# Patient Record
Sex: Female | Born: 2000 | Race: Black or African American | Hispanic: No | Marital: Single | Smoking: Never smoker
Health system: Southern US, Community
[De-identification: ages and names within clinical notes are randomized; demographics above are authoritative.]

## PROBLEM LIST (undated history)

## (undated) ENCOUNTER — Inpatient Hospital Stay (HOSPITAL_COMMUNITY): Payer: Self-pay

## (undated) DIAGNOSIS — Z789 Other specified health status: Secondary | ICD-10-CM

## (undated) HISTORY — DX: Other specified health status: Z78.9

## (undated) HISTORY — PX: NO PAST SURGERIES: SHX2092

---
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
## 2021-01-16 ENCOUNTER — Other Ambulatory Visit: Payer: Self-pay

## 2021-01-16 ENCOUNTER — Encounter (HOSPITAL_COMMUNITY): Payer: Self-pay

## 2021-01-16 ENCOUNTER — Emergency Department (HOSPITAL_COMMUNITY)
Admission: EM | Admit: 2021-01-16 | Discharge: 2021-01-16 | Disposition: A | Discharge status: Home / Self Care | Payer: Medicaid Other | Attending: Emergency Medicine | Admitting: Emergency Medicine

## 2021-01-16 DIAGNOSIS — Z20822 Contact with and (suspected) exposure to covid-19: Secondary | ICD-10-CM | POA: Diagnosis not present

## 2021-01-16 DIAGNOSIS — J029 Acute pharyngitis, unspecified: Secondary | ICD-10-CM | POA: Insufficient documentation

## 2021-01-16 LAB — RESP PANEL BY RT-PCR (FLU A&B, COVID) ARPGX2
Influenza A by PCR: NEGATIVE
Influenza B by PCR: NEGATIVE
SARS Coronavirus 2 by RT PCR: NEGATIVE

## 2021-01-16 LAB — GROUP A STREP BY PCR: Group A Strep by PCR: NOT DETECTED

## 2021-01-16 NOTE — Discharge Instructions (Signed)
COVID 19 and strep test were negative in the department today.  You may treat your symptoms with over-the-counter remedies as discussed.  More information attached to these discharge papers.  Please follow-up with a primary care provider or an urgent care if your symptoms are not improving in the next 5 days.  It was a pleasure to meet you and I hope you feel better.

## 2021-01-16 NOTE — ED Triage Notes (Signed)
Patient complains of sore throat and difficulty swallowing since am. No associated symptoms. Speaking complete sentences and no difficulty swallowing.

## 2021-01-16 NOTE — ED Notes (Signed)
Pt reports waking this am with swollen lymph nodes to right lateral neck. Pt reports hx of intermittent episodes x5 years. Pt also reports difficulty swallowing. Speaking complete sentences

## 2021-01-16 NOTE — ED Provider Notes (Signed)
[facility] EMERGENCY DEPARTMENT Provider Note   CSN: [id number] Arrival date & time: 01/16/21  1133     History No chief complaint on file.   Laelyn Blumenthal is a 20 y.o. female presenting with a complaint of sore throat, voice loss and painful swallowing that began earlier this morning.  Patient also reports that 5 years ago she began to have experience swollen lymph nodes in her neck.  This is an ongoing problem.  Patient denying symptoms such as fever, chills, cough, nasal congestion or NVD.  No chest pain or difficulty breathing.  She has not taken anything for this pain.   History reviewed. No pertinent past medical history.  There are no problems to display for this patient.   History reviewed. No pertinent surgical history.   OB History   No obstetric history on file.     Family History  Problem Relation Age of Onset   Diabetes Mother     Social History   Tobacco Use   Smoking status: Never   Smokeless tobacco: Never  Substance Use Topics   Alcohol use: No   Drug use: No    Home Medications Prior to Admission medications   Not on File    Allergies    Patient has no known allergies.  Review of Systems   Review of Systems  Constitutional: Negative.   HENT:  Positive for sore throat and trouble swallowing. Negative for congestion.   Respiratory:  Negative for cough and shortness of breath.   Cardiovascular:  Negative for chest pain and palpitations.  Gastrointestinal:  Negative for constipation, diarrhea, nausea and vomiting.  Neurological:  Negative for dizziness and headaches.  All other systems reviewed and are negative.  Physical Exam Updated Vital Signs BP 124/85   Pulse 98   Temp 98.7 F (37.1 C) (Oral)   Resp 18   SpO2 100%   Physical Exam Vitals and nursing note reviewed.  Constitutional:      Appearance: Normal appearance.  HENT:     Head: Normocephalic and atraumatic.     Right Ear: External ear normal.     Left  Ear: External ear normal.     Mouth/Throat:     Mouth: Mucous membranes are moist.     Pharynx: Oropharynx is clear.  Eyes:     General: No scleral icterus.    Conjunctiva/sclera: Conjunctivae normal.     Pupils: Pupils are equal, round, and reactive to light.  Neck:     Comments: Some tenderness along SCM of right neck.  Also with mildly enlarged cervical lymph nodes bilaterally.  No other lymphadenopathy.  Lymph nodes round and mobile. Cardiovascular:     Rate and Rhythm: Normal rate and regular rhythm.  Pulmonary:     Effort: Pulmonary effort is normal. No respiratory distress.  Musculoskeletal:     Cervical back: Normal range of motion. Tenderness present.  Lymphadenopathy:     Cervical: Cervical adenopathy present.  Skin:    Findings: No rash.  Neurological:     Mental Status: She is alert.  Psychiatric:        Mood and Affect: Mood normal.    ED Results / Procedures / Treatments   Labs (all labs ordered are listed, but only abnormal results are displayed) Labs Reviewed - No data to display  EKG None  Radiology No results found.  Procedures Procedures   Medications Ordered in ED Medications - No data to display  ED Course  I have reviewed  the triage vital signs and the nursing notes.  Pertinent labs & imaging results that were available during my care of the patient were reviewed by me and considered in my medical decision making (see chart for details).   MDM Rules/Calculators/A&P Konstance Happel is a 20 y.o. female presenting with a complaint of sore throat, voice loss and painful swallowing that began earlier this morning.  Patient also reports that 5 years ago she began to have experience swollen lymph nodes in her neck.  This is an ongoing problem.  Patient denying symptoms such as fever, chills, cough, nasal congestion or NVD.  No chest pain or difficulty breathing.  She has not taken anything for this pain.  Patient's throat was clear without signs of PTA or  other obstructing process. No enlarged tonsils and uvula midline. No exudate. Patient had cervical lymphadenopathy however I reviewed her charts and this appears to be chronic. She has been treated for this outpatient and there is no concern for emergent condition. Reports the lymph nodes to be slightly more tender and inflamed today. We discussed the function of lymph nodes and the potential for inflammation when we are fighting infection.  Patient afebrile today.  COVID and strep test were negative in the department.  We discussed that because the symptoms began this morning it may be too soon for positive COVID test.  Patient continued without shortness of breath, chest pain or difficulty swallowing throughout her evaluation today.  Drinking fluids at this time.  I suspect her symptoms to be due to a very early viral process.  We discussed over-the-counter management and I also attached this to her discharge papers.  Return precautions such as shortness of breath, chest pain, inability to tolerate fluids or food or overall worsening of her condition were discussed.  These were also attached to her discharge papers.  Patient agreeable to discharge at this time.  Final Clinical Impression(s) / ED Diagnoses Final diagnoses:  Pharyngitis, unspecified etiology    Rx / DC Orders Results and diagnoses were explained to the patient. Return precautions discussed in full. Patient had no additional questions and expressed complete understanding.     Saddie Benders, PA-C 01/16/21 1542    Franne Forts, DO 01/17/21 1127

## 2021-07-28 ENCOUNTER — Encounter (HOSPITAL_COMMUNITY): Payer: Self-pay

## 2021-07-28 ENCOUNTER — Ambulatory Visit (HOSPITAL_COMMUNITY)
Admission: EM | Admit: 2021-07-28 | Discharge: 2021-07-28 | Disposition: A | Discharge status: Home / Self Care | Payer: Medicaid Other | Attending: Nurse Practitioner | Admitting: Nurse Practitioner

## 2021-07-28 ENCOUNTER — Ambulatory Visit (INDEPENDENT_AMBULATORY_CARE_PROVIDER_SITE_OTHER): Payer: Medicaid Other

## 2021-07-28 DIAGNOSIS — M79644 Pain in right finger(s): Secondary | ICD-10-CM | POA: Diagnosis not present

## 2021-07-28 DIAGNOSIS — L03011 Cellulitis of right finger: Secondary | ICD-10-CM | POA: Diagnosis not present

## 2021-07-28 MED ORDER — KETOROLAC TROMETHAMINE 30 MG/ML IJ SOLN
30.0000 mg | Freq: Once | INTRAMUSCULAR | Status: AC
  Administered 2021-07-28: 30 mg via INTRAMUSCULAR

## 2021-07-28 MED ORDER — KETOROLAC TROMETHAMINE 60 MG/2ML IM SOLN
INTRAMUSCULAR | Status: AC
  Filled 2021-07-28: qty 2

## 2021-07-28 MED ORDER — DOXYCYCLINE HYCLATE 100 MG PO CAPS: 100.0000 mg | ORAL_CAPSULE | Freq: Two times a day (BID) | ORAL | 0 refills | Status: AC

## 2021-07-28 NOTE — Discharge Instructions (Signed)
-   We gave you a shot of Toradol 30 mg IM in the office today ?- Please start antibiotics for the infection in your nail bed ?- You can also use warm soaks to help draw the infection out  ?- Alternate Tylenol and ibuprofen for pain ?- If the swelling worsens, please come back to see [place] ?

## 2021-07-28 NOTE — ED Triage Notes (Signed)
Pt presents with c/o pain and swelling to the R middle finger.  ?

## 2021-07-28 NOTE — ED Provider Notes (Signed)
?[facility] ? ? ? ?CSN: [id number] ?Arrival date & time: 07/28/21  1155 ? ? ?  ? ?History   ?Chief Complaint ?Chief Complaint  ?Patient presents with  ? swollen finger  ? ? ?HPI ?Selena Dawson is a 21 y.o. female.  ? ?Patient reports pain to the right middle finger that started at 2 am today.  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  She denies fevers, nausea/vomiting, and numbness or tinging in her fingers.  She reports the pain in her right middle finger is severe.  She has not taken anything for the pain. ? ?LMP 07/26/2021. ? ? ?History reviewed. No pertinent past medical history. ? ?There are no problems to display for this patient. ? ? ?History reviewed. No pertinent surgical history. ? ?OB History   ?No obstetric history on file. ?  ? ? ? ?Home Medications   ? ?Prior to Admission medications   ?Medication Sig Start Date End Date Taking? Authorizing Provider  ?doxycycline (VIBRAMYCIN) 100 MG capsule Take 1 capsule (100 mg total) by mouth 2 (two) times daily for 7 days. 07/28/21 08/04/21 Yes Valentino Nose, NP  ? ? ?Family History ?Family History  ?Problem Relation Age of Onset  ? Diabetes Mother   ? ? ?Social History ?Social History  ? ?Tobacco Use  ? Smoking status: Never  ? Smokeless tobacco: Never  ?Substance Use Topics  ? Alcohol use: No  ? Drug use: No  ? ? ? ?Allergies   ?Patient has no known allergies. ? ? ?Review of Systems ?Review of Systems ?Per HPI ? ?Physical Exam ?Triage Vital Signs ?ED Triage Vitals [07/28/21 1305]  ?Enc Vitals Group  ?   BP 133/89  ?   Pulse Rate 67  ?   Resp 19  ?   Temp   ?   Temp src   ?   SpO2 96 %  ?   Weight   ?   Height   ?   Head Circumference   ?   Peak Flow   ?   Pain Score 10  ?   Pain Loc   ?   Pain Edu?   ?   Excl. in GC?   ? ?No data found. ? ?Updated Vital Signs ?BP 133/89 (BP Location: Right Arm)   Pulse 67   Resp 19   LMP 07/26/2021 (Exact Date)   SpO2 96%  ? ?Visual Acuity ?Right  Eye Distance:   ?Left Eye Distance:   ?Bilateral Distance:   ? ?Right Eye Near:   ?Left Eye Near:    ?Bilateral Near:    ? ?Physical Exam ?Vitals reviewed.  ?Constitutional:   ?   General: She is not in acute distress. ?   Appearance: Normal appearance. She is not toxic-appearing.  ?HENT:  ?   Head: Normocephalic and atraumatic.  ?Pulmonary:  ?   Effort: Pulmonary effort is normal. No respiratory distress.  ?Musculoskeletal:  ?   Right hand: Swelling present. No tenderness or bony tenderness. Decreased range of motion. Normal strength. Normal sensation. Normal capillary refill. Normal pulse.  ?   Left hand: Normal. No tenderness. Normal strength. Normal sensation. Normal capillary refill. Normal pulse.  ?   Comments: Right third finger with firm paronychia to nail bed  ?Skin: ?   General: Skin is warm and dry.  ?   Coloration: Skin is not jaundiced or pale.  ?  Findings: Erythema present.  ?Neurological:  ?   Mental Status: She is alert and oriented to person, place, and time.  ?   Motor: No weakness.  ?   Gait: Gait normal.  ?Psychiatric:     ?   Mood and Affect: Mood normal.     ?   Behavior: Behavior normal.     ?   Thought Content: Thought content normal.     ?   Judgment: Judgment normal.  ? ? ? ?UC Treatments / Results  ?Labs ?(all labs ordered are listed, but only abnormal results are displayed) ?Labs Reviewed - No data to display ? ?EKG ? ? ?Radiology ?DG Finger Middle Right ? ?Result Date: 07/28/2021 ?CLINICAL DATA:  Pain and swelling EXAM: RIGHT MIDDLE FINGER 3 views COMPARISON:  None. FINDINGS: There is no evidence of fracture or dislocation. There is no evidence of arthropathy or other focal bone abnormality. Soft tissues are unremarkable. IMPRESSION: No radiographic abnormality is seen in the right middle finger. Electronically Signed   By: Ernie Avena M.D.   On: 07/28/2021 14:01   ? ?Procedures ?Procedures (including critical care time) ? ?Medications Ordered in UC ?Medications  ?ketorolac  (TORADOL) 30 MG/ML injection 30 mg (30 mg Intramuscular Given 07/28/21 1408)  ? ? ?Initial Impression / Assessment and Plan / UC Course  ?I have reviewed the triage vital signs and the nursing notes. ? ?Pertinent labs & imaging results that were available during my care of the patient were reviewed by me and considered in my medical decision making (see chart for details). ? ?  ?Finger x-ray today does not show any acute bony abnormality.  Suspect paronychia of right third digit.  Will not drain as it is firm.  Instead, will start on doxycycline 100 mg twice daily for 7 days.  Encouraged warm soaks of the right third digit to help with drainage.  Can use Tylenol and ibuprofen alternating for pain. ?Final Clinical Impressions(s) / UC Diagnoses  ? ?Final diagnoses:  ?Paronychia of finger of right hand  ? ? ? ?Discharge Instructions   ? ?  ?- We gave you a shot of Toradol 30 mg IM in the office today ?- Please start antibiotics for the infection in your nail bed ?- You can also use warm soaks to help draw the infection out  ?- Alternate Tylenol and ibuprofen for pain ?- If the swelling worsens, please come back to see [place] ? ? ? ? ?ED Prescriptions   ? ? Medication Sig Dispense Auth. Provider  ? doxycycline (VIBRAMYCIN) 100 MG capsule Take 1 capsule (100 mg total) by mouth 2 (two) times daily for 7 days. 14 capsule Valentino Nose, NP  ? ?  ? ?PDMP not reviewed this encounter. ?  ?Valentino Nose, NP ?07/28/21 1442 ? ?

## 2021-08-16 IMAGING — DX DG CHEST 2V
2 series · 2 of 2 positions shown · non-contrast
Comparison: Chest x-ray 06/29/2015.

CLINICAL DATA: 19-year-old female with history of cervical
lymphadenopathy.

EXAM:
CHEST - 2 VIEW

[chest pa]
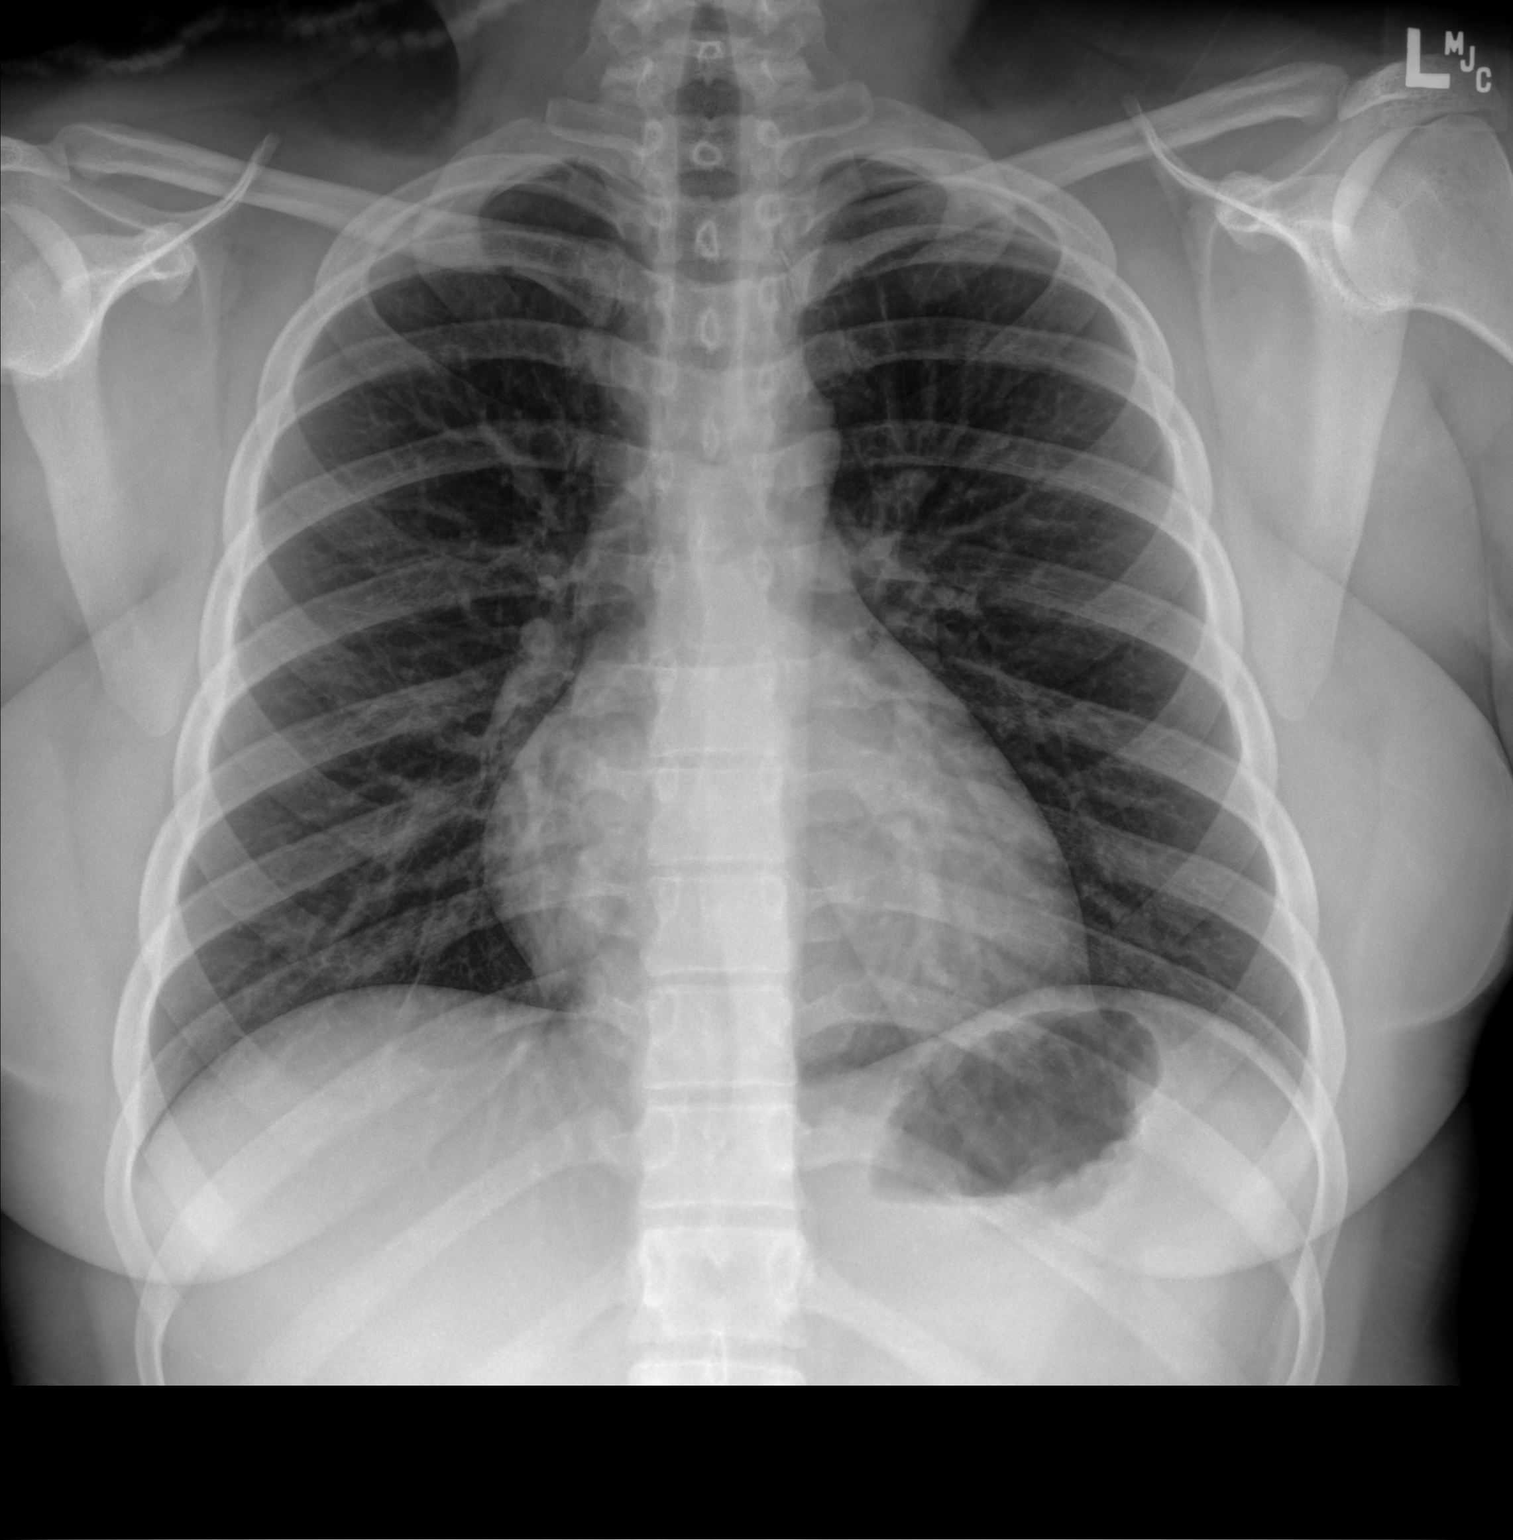

[chest lat]
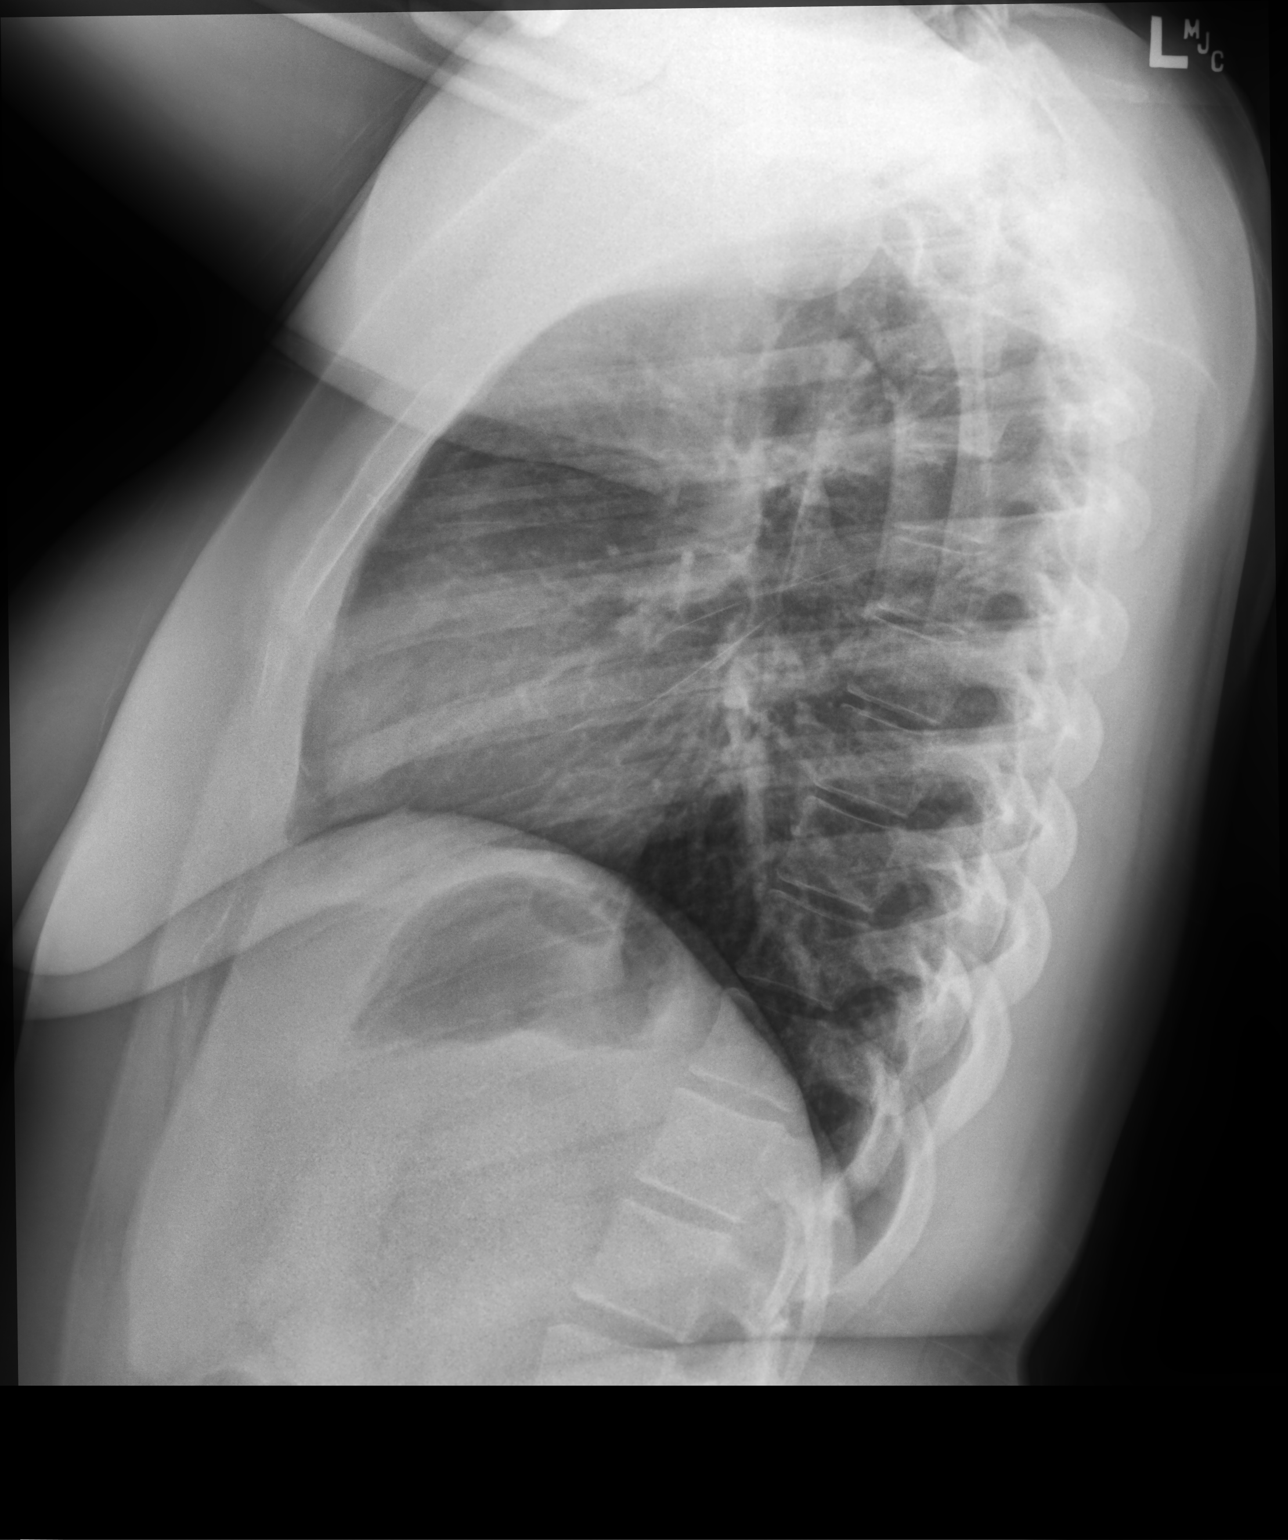

[2 of 2 positions shown; findings below may reference images not displayed]

FINDINGS: Lung volumes are normal. No consolidative airspace disease. No
pleural effusions. No pneumothorax. No pulmonary nodule or mass
noted. Pulmonary vasculature and the cardiomediastinal silhouette
are within normal limits.
IMPRESSION: No radiographic evidence of acute cardiopulmonary disease.

## 2021-08-18 IMAGING — US US SOFT TISSUE HEAD/NECK
1 series · 14 of 21 positions shown · non-contrast
Comparison: None.

CLINICAL DATA: 19-year-old female with a history lymphadenopathy

EXAM:
ULTRASOUND OF HEAD/NECK SOFT TISSUES
TECHNIQUE: Ultrasound examination of the head and neck soft tissues was
performed in the area of clinical concern.

[Series 1: us soft tissue head/neck · 0.07mm/px · 14 of 21 slices shown]
[im 1/21]
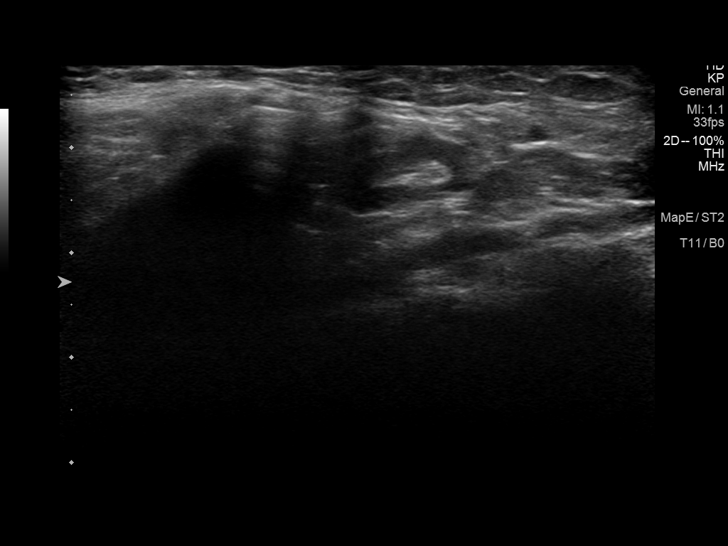
[im 3/21]
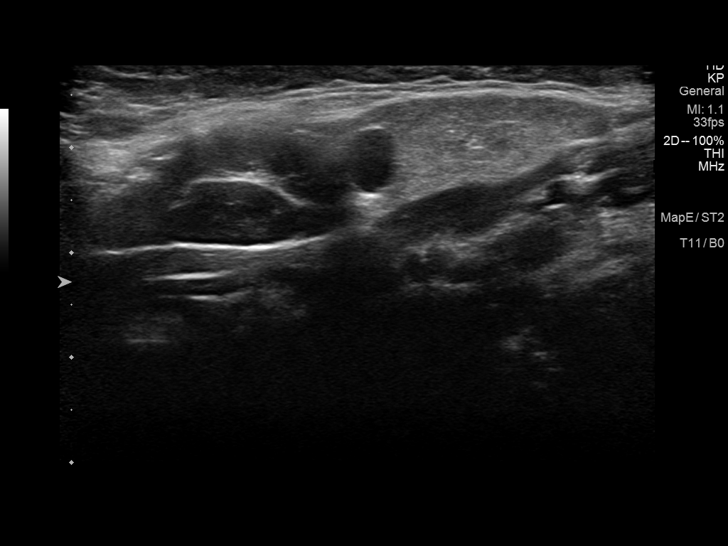
[im 4/21]
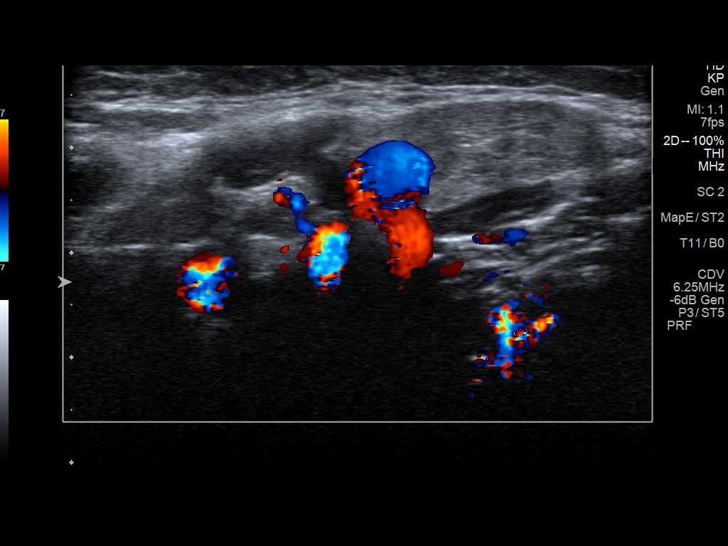
[im 6/21]
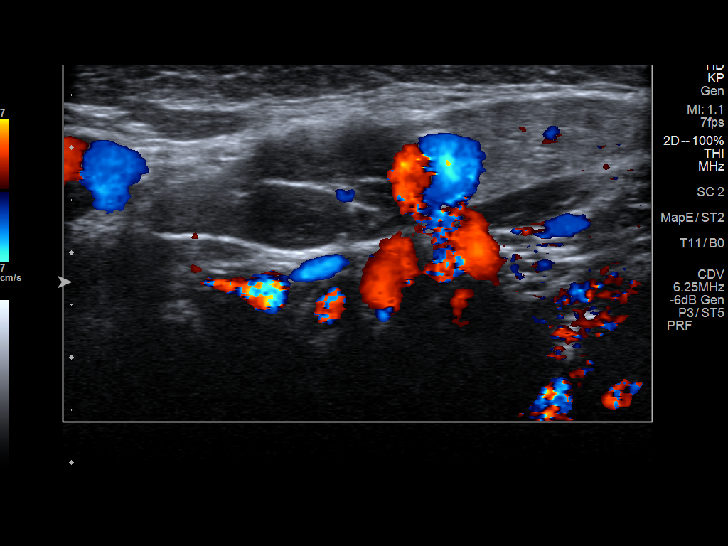
[im 7/21]
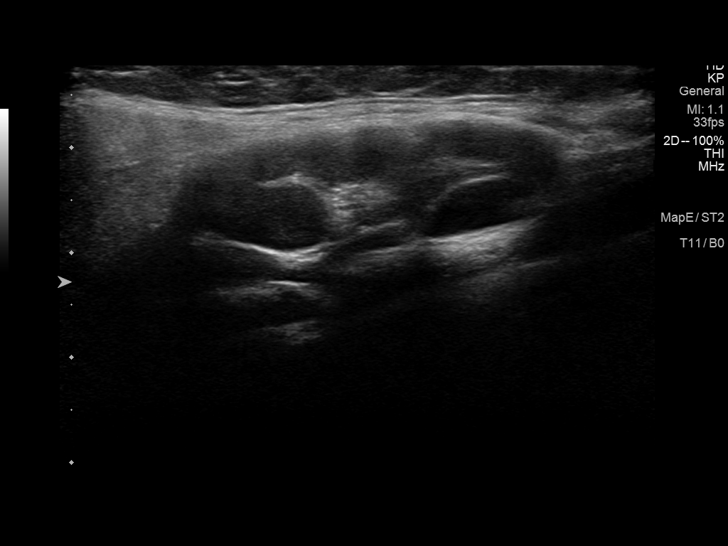
[im 9/21]
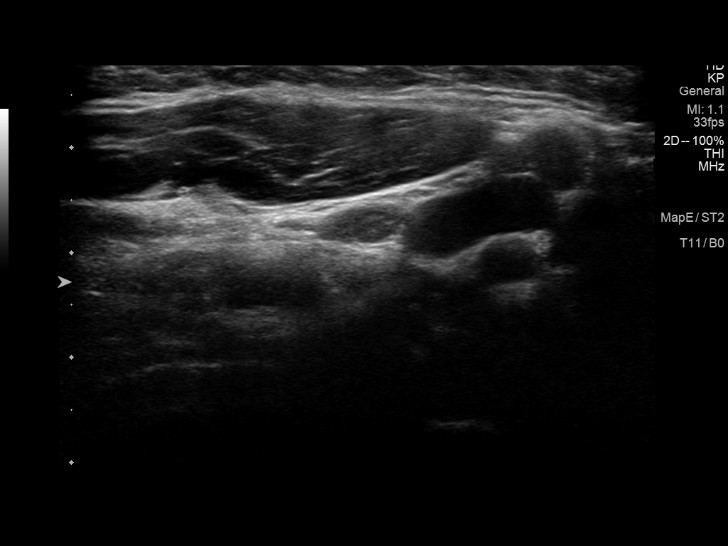
[im 10/21]
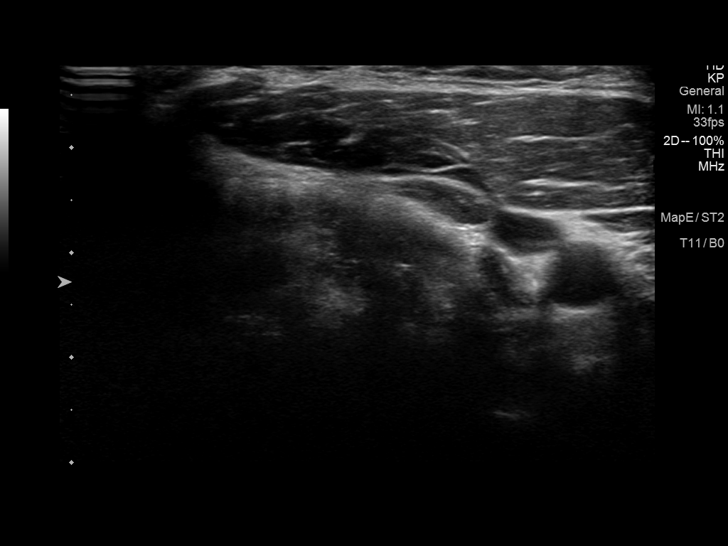
[im 12/21]
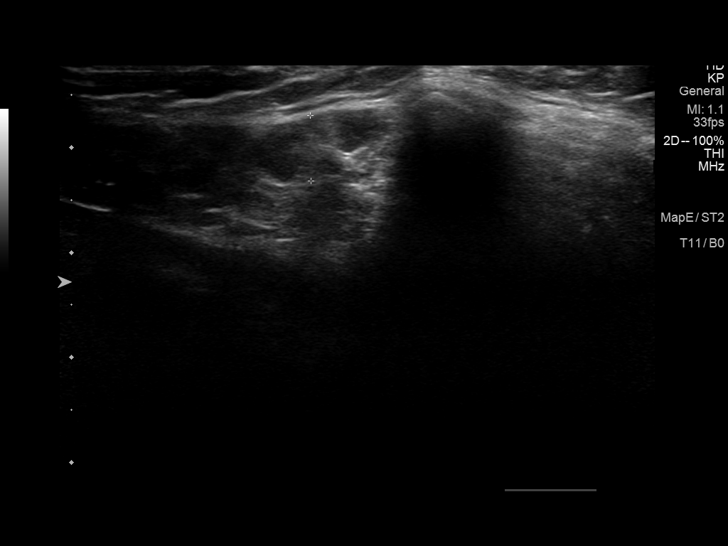
[im 13/21]
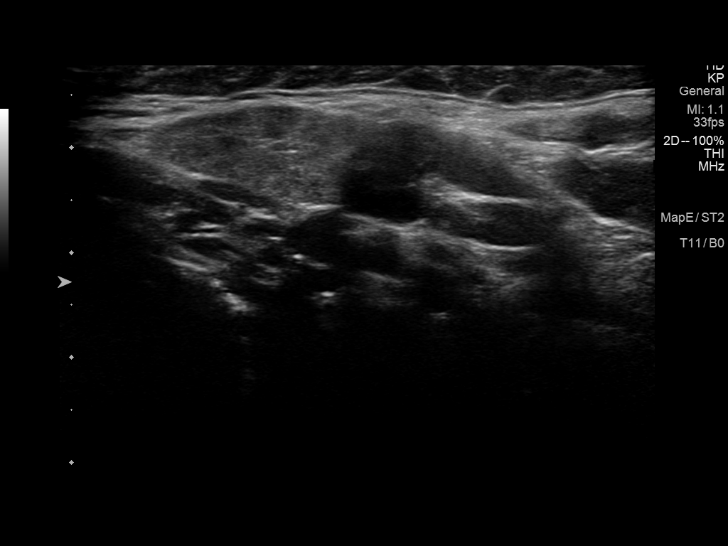
[im 15/21]
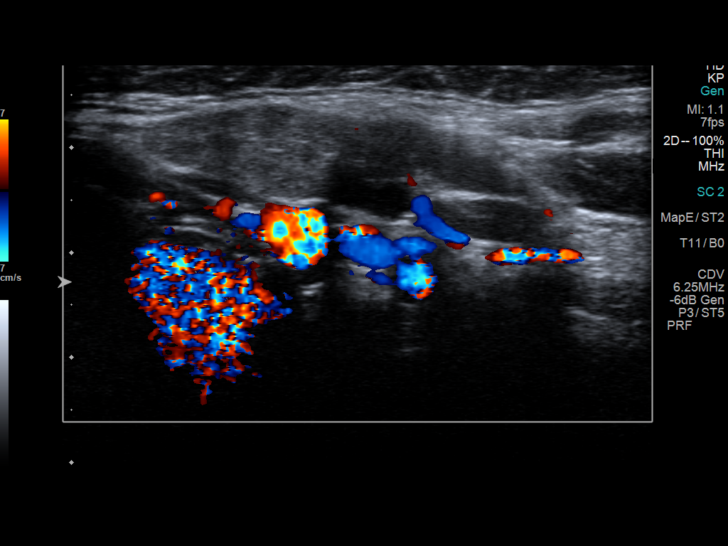
[im 16/21]
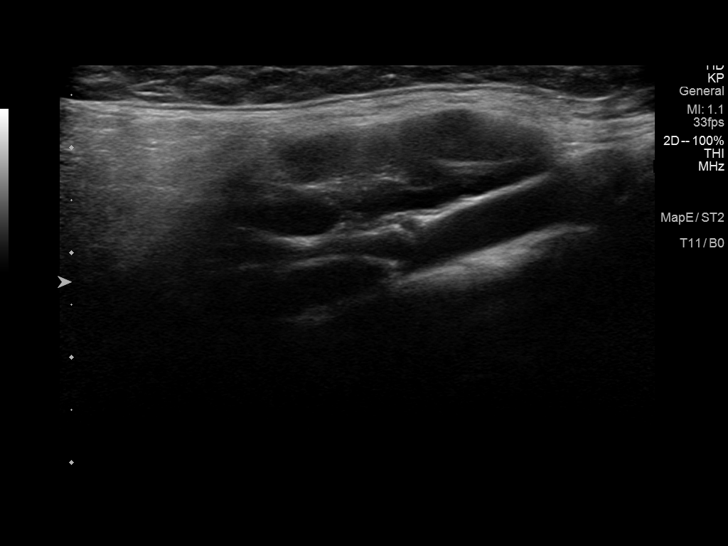
[im 18/21]
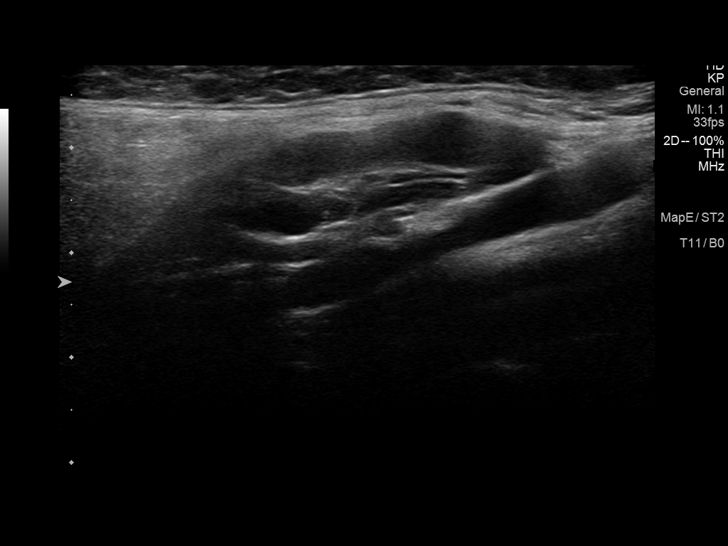
[im 19/21]
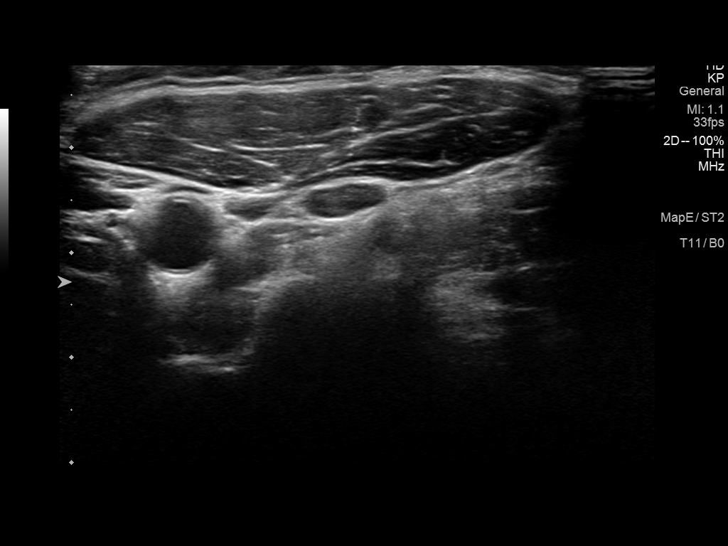
[im 21/21]
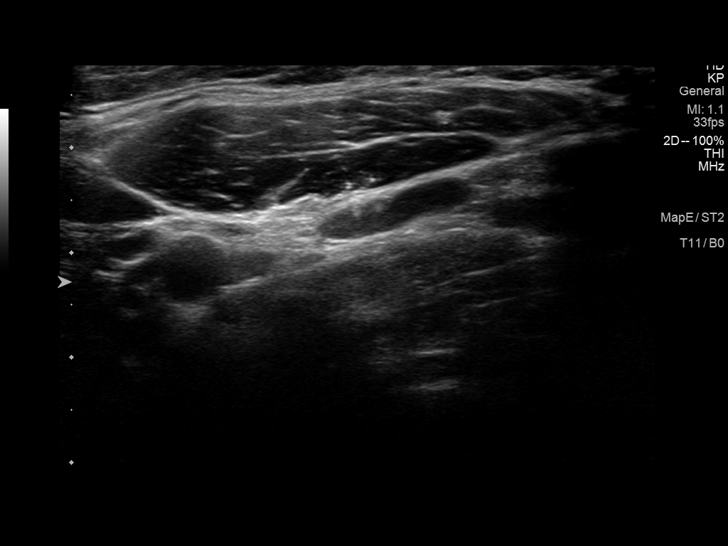

[14 of 21 positions shown; findings below may reference images not displayed]

FINDINGS: Sonographic survey with grayscale and color duplex performed in the
region clinical concern.

Typical appearing lymph nodes are present, not enlarged. No focal
fluid or soft tissue lesions.
IMPRESSION: Sonographic survey demonstrates typical appearing lymph nodes,
nonspecific though potentially reactive.

## 2021-09-08 ENCOUNTER — Telehealth: Payer: Medicaid Other | Admitting: Physician Assistant

## 2021-09-08 DIAGNOSIS — L700 Acne vulgaris: Secondary | ICD-10-CM | POA: Diagnosis not present

## 2021-09-08 MED ORDER — CLINDAMYCIN PHOS-BENZOYL PEROX 1-5 % EX GEL: Freq: Two times a day (BID) | CUTANEOUS | 0 refills | Status: DC

## 2021-09-08 NOTE — Progress Notes (Signed)
We are sorry that you are experiencing this issue.  Here is how we plan to help!  Based on what you shared with me it looks like you have uncomplicated acne.  Acne is a disorder of the hair follicles and oil glands (sebaceous glands). The sebaceous glands secrete oils to keep the skin moist.  When the glands get clogged, it can lead to pimples or cysts.  These cysts may become infected and leave scars. Acne is very common and normally occurs at puberty.  Acne is also inherited.  Your personal care plan consists of the following recommendations:  I recommend that you use a daily cleanser  You may try a topical exfoliator and salicylic acid scrub.  These scrubs have coarse particles that clear your pores but may also irritate your skin.  I have prescribed a topical gel with an antibiotic:  Clindamycin-benzoyl peroxide gel.  This gel should be applied to the affected areas twice a day.  Be sure to read the package insert to understand potential side effects.  If excessive dryness or peeling occurs, reduce dose frequency or concentration of the topical scrubs.  If excessive stinging or burning occurs, remove the topical gel with mild soap and water and resume at a lower dose the next day.  Remember oral antibiotics and topical acne treatments may increase your sensitivity to the sun!  HOME CARE: Do not squeeze pimples because that can often lead to infections, worse acne, and scars. Use a moisturizer that contains retinoid or fruit acids that may inhibit the development of new acne lesions. Although there is not a clear link that foods can cause acne, doctors do believe that too many sweets predispose you to skin problems.  GET HELP RIGHT AWAY IF: If your acne gets worse or is not better within 10 days. If you become depressed. If you become pregnant, discontinue medications and call your OB/GYN.  MAKE SURE YOU: Understand these instructions. Will watch your condition. Will get help right  away if you are not doing well or get worse.  Thank you for choosing an e-visit.  Your e-visit answers were reviewed by a board certified advanced clinical practitioner to complete your personal care plan. Depending upon the condition, your plan could have included both over the counter or prescription medications.  Please review your pharmacy choice. Make sure the pharmacy is open so you can pick up prescription now. If there is a problem, you may contact your provider through MyChart messaging and have the prescription routed to another pharmacy.  Your safety is important to us. If you have drug allergies check your prescription carefully.   For the next 24 hours you can use MyChart to ask questions about today's visit, request a non-urgent call back, or ask for a work or school excuse. You will get an email in the next two days asking about your experience. I hope that your e-visit has been valuable and will speed your recovery.  I provided 5 minutes of non face-to-face time during this encounter for chart review and documentation.   

## 2021-11-23 ENCOUNTER — Encounter (HOSPITAL_COMMUNITY): Payer: Self-pay | Admitting: *Deleted

## 2021-11-23 ENCOUNTER — Ambulatory Visit (HOSPITAL_COMMUNITY)
Admission: EM | Admit: 2021-11-23 | Discharge: 2021-11-23 | Disposition: A | Discharge status: Home / Self Care | Payer: Medicaid Other | Attending: Physician Assistant | Admitting: Physician Assistant

## 2021-11-23 DIAGNOSIS — J0140 Acute pansinusitis, unspecified: Secondary | ICD-10-CM | POA: Diagnosis not present

## 2021-11-23 MED ORDER — AMOXICILLIN-POT CLAVULANATE 875-125 MG PO TABS: 1.0000 | ORAL_TABLET | Freq: Two times a day (BID) | ORAL | 0 refills | Status: DC

## 2021-11-23 MED ORDER — PREDNISONE 20 MG PO TABS: 40.0000 mg | ORAL_TABLET | Freq: Every day | ORAL | 0 refills | Status: DC

## 2021-11-23 MED ORDER — PREDNISONE 20 MG PO TABS: 40.0000 mg | ORAL_TABLET | Freq: Every day | ORAL | 0 refills | Status: AC

## 2021-11-23 NOTE — ED Triage Notes (Addendum)
C/O cough x 2 wks; states started with sore throat and feels she has swollen lymph nodes onset 3 days ago. Denies fevers. C/O runny nose. Pt frequently coughing and sniffling throughout triage.

## 2021-11-23 NOTE — ED Provider Notes (Signed)
[facility]    CSN: [id number] Arrival date & time: 11/23/21  1527      History   Chief Complaint Chief Complaint  Patient presents with   Sore Throat   Cough    HPI Selena Dawson is a 21 y.o. female.   Patient presents today with a 2-week history of URI symptoms.  Reports nasal congestion, cough, ear fullness, headache.  Denies any chest pain, shortness of breath, nausea, vomiting.  She does believe that she has allergies but has never been officially diagnosed with this and does not take any over-the-counter medications on a regular basis.  Denies history of asthma, COPD, smoking.  She has had COVID in the past.  Last episode was approximately 1 year ago.  She has not had a COVID-vaccine.  She is confident that she is not pregnant.  Denies any recent antibiotic or steroid use.  She has been having difficulty with daily duties as a result of symptoms; has trouble sleeping due to severity of cough.    History reviewed. No pertinent past medical history.  There are no problems to display for this patient.   History reviewed. No pertinent surgical history.  OB History   No obstetric history on file.      Home Medications    Prior to Admission medications   Medication Sig Start Date End Date Taking? Authorizing Provider  amoxicillin-clavulanate (AUGMENTIN) 875-125 MG tablet Take 1 tablet by mouth every 12 (twelve) hours. 11/23/21  Yes Nilam Quakenbush K, PA-C  predniSONE (DELTASONE) 20 MG tablet Take 2 tablets (40 mg total) by mouth daily for 3 days. 11/23/21 11/26/21 Yes Briya Lookabaugh, Noberto Retort, PA-C    Family History Family History  Problem Relation Age of Onset   Diabetes Mother     Social History Social History   Tobacco Use   Smoking status: Never   Smokeless tobacco: Never  Vaping Use   Vaping Use: Never used  Substance Use Topics   Alcohol use: Yes    Comment: occasionally   Drug use: Yes    Types: Marijuana     Allergies   Patient has no known  allergies.   Review of Systems Review of Systems  Constitutional:  Positive for activity change. Negative for appetite change, fatigue and fever.  HENT:  Positive for congestion, sinus pressure and sore throat. Negative for sneezing.   Respiratory:  Positive for cough. Negative for shortness of breath.   Cardiovascular:  Negative for chest pain.  Gastrointestinal:  Negative for abdominal pain, diarrhea, nausea and vomiting.  Neurological:  Positive for headaches. Negative for dizziness and light-headedness.     Physical Exam Triage Vital Signs ED Triage Vitals  Enc Vitals Group     BP 11/23/21 1618 118/68     Pulse Rate 11/23/21 1618 78     Resp 11/23/21 1618 16     Temp 11/23/21 1618 98.3 F (36.8 C)     Temp Source 11/23/21 1618 Oral     SpO2 11/23/21 1618 98 %     Weight --      Height --      Head Circumference --      Peak Flow --      Pain Score 11/23/21 1619 8     Pain Loc --      Pain Edu? --      Excl. in GC? --    No data found.  Updated Vital Signs BP 118/68   Pulse 78   Temp  98.3 F (36.8 C) (Oral)   Resp 16   LMP 10/23/2021 (Exact Date)   SpO2 98%   Visual Acuity Right Eye Distance:   Left Eye Distance:   Bilateral Distance:    Right Eye Near:   Left Eye Near:    Bilateral Near:     Physical Exam Vitals reviewed.  Constitutional:      General: She is awake. She is not in acute distress.    Appearance: Normal appearance. She is well-developed. She is not ill-appearing.     Comments: Very pleasant female presented age in no acute distress sitting comfortably in exam room  HENT:     Head: Normocephalic and atraumatic.     Right Ear: Tympanic membrane, ear canal and external ear normal. Tympanic membrane is not erythematous or bulging.     Left Ear: Tympanic membrane, ear canal and external ear normal. Tympanic membrane is not erythematous or bulging.     Nose:     Right Sinus: Maxillary sinus tenderness present. No frontal sinus tenderness.      Left Sinus: Maxillary sinus tenderness present. No frontal sinus tenderness.     Mouth/Throat:     Pharynx: Uvula midline. Posterior oropharyngeal erythema present. No oropharyngeal exudate.     Comments: Drainage present posterior oropharynx Cardiovascular:     Rate and Rhythm: Normal rate and regular rhythm.     Heart sounds: Normal heart sounds, S1 normal and S2 normal. No murmur heard. Pulmonary:     Effort: Pulmonary effort is normal.     Breath sounds: Normal breath sounds. No wheezing, rhonchi or rales.     Comments: Clear to auscultation bilaterally Psychiatric:        Behavior: Behavior is cooperative.      UC Treatments / Results  Labs (all labs ordered are listed, but only abnormal results are displayed) Labs Reviewed - No data to display  EKG   Radiology No results found.  Procedures Procedures (including critical care time)  Medications Ordered in UC Medications - No data to display  Initial Impression / Assessment and Plan / UC Course  I have reviewed the triage vital signs and the nursing notes.  Pertinent labs & imaging results that were available during my care of the patient were reviewed by me and considered in my medical decision making (see chart for details).     No indication for viral testing as patient has been symptomatic for several weeks and this would not change management.  Given prolonged and worsening symptoms will cover for secondary infection with antibiotics.  Patient was started on Augmentin twice daily for 7 days with instruction to take this with food to prevent GI side effects.  We will also start prednisone burst of 40 mg for 3 days.  Discussed that she should not take NSAIDs with this medication due to risk of GI bleeding.  Can take Mucinex, Flonase, Tylenol for additional symptom relief.  She is to rest and drink plenty of fluid.  Discussed that her symptoms are not improving within a week she needs to return for reevaluation.   If she has any worsening symptoms including chest pain, shortness of breath, headache, dizziness, fever, nausea, vomiting she is to be seen immediately.  Strict return precautions given.  Final Clinical Impressions(s) / UC Diagnoses   Final diagnoses:  Acute non-recurrent pansinusitis     Discharge Instructions      Start Augmentin twice daily for 7 days.  Take this with food as it  will upset your stomach.  Take prednisone 40 mg for 3 days.  Do not take NSAIDs with this medication including aspirin, ibuprofen/Advil, naproxen/Aleve as it will cause stomach bleeding.  You can use Tylenol, Mucinex, Flonase for symptom relief.  Make sure you rest and drink plenty fluid.  If your symptoms not proving by next week return for reevaluation.  If anything worsens you need to be seen immediately.     ED Prescriptions     Medication Sig Dispense Auth. Provider   amoxicillin-clavulanate (AUGMENTIN) 875-125 MG tablet Take 1 tablet by mouth every 12 (twelve) hours. 14 tablet Anhad Sheeley K, PA-C   predniSONE (DELTASONE) 20 MG tablet Take 2 tablets (40 mg total) by mouth daily for 3 days. 6 tablet Robbin Escher, Noberto Retort, PA-C      PDMP not reviewed this encounter.   Jeani Hawking, PA-C 11/23/21 1648

## 2021-11-23 NOTE — Discharge Instructions (Signed)
Start Augmentin twice daily for 7 days.  Take this with food as it will upset your stomach.  Take prednisone 40 mg for 3 days.  Do not take NSAIDs with this medication including aspirin, ibuprofen/Advil, naproxen/Aleve as it will cause stomach bleeding.  You can use Tylenol, Mucinex, Flonase for symptom relief.  Make sure you rest and drink plenty fluid.  If your symptoms not proving by next week return for reevaluation.  If anything worsens you need to be seen immediately.

## 2022-08-19 ENCOUNTER — Encounter (HOSPITAL_COMMUNITY): Payer: Self-pay

## 2022-08-19 ENCOUNTER — Ambulatory Visit (HOSPITAL_COMMUNITY)
Admission: EM | Admit: 2022-08-19 | Discharge: 2022-08-19 | Disposition: A | Discharge status: Home / Self Care | Payer: Medicaid Other | Attending: Emergency Medicine | Admitting: Emergency Medicine

## 2022-08-19 DIAGNOSIS — J069 Acute upper respiratory infection, unspecified: Secondary | ICD-10-CM | POA: Diagnosis present

## 2022-08-19 LAB — POCT RAPID STREP A (OFFICE): Rapid Strep A Screen: NEGATIVE

## 2022-08-19 MED ORDER — PROMETHAZINE-DM 6.25-15 MG/5ML PO SYRP: 5.0000 mL | ORAL_SOLUTION | Freq: Four times a day (QID) | ORAL | 0 refills | Status: DC | PRN

## 2022-08-19 NOTE — ED Provider Notes (Signed)
[facility]    CSN: [id number] Arrival date & time: 08/19/22  8469      History   Chief Complaint Chief Complaint  Patient presents with   Sore Throat   Dizziness    HPI Selena Dawson is a 22 y.o. female.   Patient presents to clinic for sore throat, swollen lymph nodes, dry cough and nausea for the past 3 days. Reports subjective fever with night sweats, hot and cold chills.  She has been coughing up phlegm and this makes her nauseous.  Denies abdominal pain, emesis or diarrhea.  Denies wheezing, shortness of breath or chest pain.  She has taken ibuprofen for her pain 2 days ago.  Tearful in clinic.  The history is provided by the patient and medical records.  Sore Throat Pertinent negatives include no chest pain, no abdominal pain and no shortness of breath.  Dizziness Associated symptoms: nausea   Associated symptoms: no chest pain, no diarrhea, no shortness of breath and no vomiting     History reviewed. No pertinent past medical history.  There are no problems to display for this patient.   History reviewed. No pertinent surgical history.  OB History   No obstetric history on file.      Home Medications    Prior to Admission medications   Medication Sig Start Date End Date Taking? Authorizing Provider  promethazine-dextromethorphan (PROMETHAZINE-DM) 6.25-15 MG/5ML syrup Take 5 mLs by mouth 4 (four) times daily as needed for cough. 08/19/22  Yes Vendetta Pittinger, [place], FNP    Family History Family History  Problem Relation Age of Onset   Diabetes Mother     Social History Social History   Tobacco Use   Smoking status: Never   Smokeless tobacco: Never  Vaping Use   Vaping Use: Every day   Substances: Nicotine, Flavoring  Substance Use Topics   Alcohol use: Yes    Comment: occasionally   Drug use: Yes    Types: Marijuana     Allergies   Patient has no known allergies.   Review of Systems Review of Systems  Constitutional:   Positive for chills and fever.  HENT:  Positive for congestion and sore throat.   Respiratory:  Positive for cough. Negative for shortness of breath and wheezing.   Cardiovascular:  Negative for chest pain.  Gastrointestinal:  Positive for nausea. Negative for abdominal pain, diarrhea and vomiting.  Genitourinary:  Negative for dysuria.  Neurological:  Positive for dizziness.     Physical Exam Triage Vital Signs ED Triage Vitals [08/19/22 0954]  Enc Vitals Group     BP      Pulse      Resp      Temp      Temp src      SpO2      Weight      Height      Head Circumference      Peak Flow      Pain Score 8     Pain Loc      Pain Edu?      Excl. in GC?    No data found.  Updated Vital Signs BP 107/72 (BP Location: Left Arm)   Pulse 63   Temp 98.5 F (36.9 C) (Oral)   Resp 14   LMP 07/25/2022 (Approximate)   SpO2 96%   Visual Acuity Right Eye Distance:   Left Eye Distance:   Bilateral Distance:    Right Eye Near:   Left Eye  Near:    Bilateral Near:     Physical Exam Vitals and nursing note reviewed.  Constitutional:      Appearance: She is well-developed.  HENT:     Head: Normocephalic and atraumatic.     Nose: Congestion and rhinorrhea present.     Mouth/Throat:     Mouth: Mucous membranes are moist.     Pharynx: Posterior oropharyngeal erythema present.     Tonsils: No tonsillar exudate or tonsillar abscesses. 1+ on the right. 1+ on the left.  Eyes:     Conjunctiva/sclera: Conjunctivae normal.     Pupils: Pupils are equal, round, and reactive to light.  Cardiovascular:     Rate and Rhythm: Normal rate and regular rhythm.     Heart sounds: Normal heart sounds. No murmur heard. Pulmonary:     Effort: Pulmonary effort is normal. No respiratory distress.     Breath sounds: Normal breath sounds.  Musculoskeletal:     Cervical back: Normal range of motion and neck supple.  Lymphadenopathy:     Cervical: Cervical adenopathy present.  Skin:    General:  Skin is warm and dry.  Neurological:     Mental Status: She is alert and oriented to person, place, and time.  Psychiatric:        Mood and Affect: Mood normal.        Behavior: Behavior normal.      UC Treatments / Results  Labs (all labs ordered are listed, but only abnormal results are displayed) Labs Reviewed  CULTURE, GROUP A STREP [facility])  POCT RAPID STREP A (OFFICE)    EKG   Radiology No results found.  Procedures Procedures (including critical care time)  Medications Ordered in UC Medications - No data to display  Initial Impression / Assessment and Plan / UC Course  I have reviewed the triage vital signs and the nursing notes.  Pertinent labs & imaging results that were available during my care of the patient were reviewed by me and considered in my medical decision making (see chart for details).  Vitals and triage reviewed, patient is hemodynamically stable.  Posterior pharynx with erythema, without exudate or uvula deviation.  Low concern for peritonsillar abscess.  Lungs vesicular, afebrile without tachycardia on exam.  Rapid strep in clinic negative, will send for culture.  Discussed that symptoms are consistent with viral upper respiratory infection.  Treat symptomatically.  Urged to follow-up care reviewed, patient verbalized understanding, no questions at this time.     Final Clinical Impressions(s) / UC Diagnoses   Final diagnoses:  Viral upper respiratory tract infection with cough     Discharge Instructions      Your rapid strep was negative today in clinic, we will send this off for culture and contact you if we need to start you on antibiotics.  Overall your symptoms are consistent with a viral upper respiratory infection.  Please alternate between Tylenol and ibuprofen every 4-6 hours for fever, hot and cold chills, sweats and pain.  You can also take the cough syrup as needed up to 4 times daily.  Do not drink or drive on this medication as it  may make you drowsy.  Symptomatic measures for your sore throat include sleeping with a humidifier, tea with honey, and warm saline gargles.  The cough syrup should help as well as this will decrease irritation.  Please ensure you are drinking at least 64 ounces of water to stay hydrated.  You can also drink an electrolyte  solution like Pedialyte, liquid IV or Gatorade.  Please return to clinic or follow-up with your primary care if you develop no improvement in symptoms over the next week or any worsening of condition.       ED Prescriptions     Medication Sig Dispense Auth. Provider   promethazine-dextromethorphan (PROMETHAZINE-DM) 6.25-15 MG/5ML syrup Take 5 mLs by mouth 4 (four) times daily as needed for cough. 118 mL Cambren Helm, [place], [place]      PDMP not reviewed this encounter.   Amaziah Raisanen, [place] N, [place] 08/19/22 1057

## 2022-08-19 NOTE — ED Triage Notes (Signed)
Patient c/o sore throat and swollen lymph nodes on  both sides. Patient also c/o dizziness x 3 days.  Patient denies taking any medications for her symptoms.

## 2022-08-19 NOTE — Discharge Instructions (Addendum)
Your rapid strep was negative today in clinic, we will send this off for culture and contact you if we need to start you on antibiotics.  Overall your symptoms are consistent with a viral upper respiratory infection.  Please alternate between Tylenol and ibuprofen every 4-6 hours for fever, hot and cold chills, sweats and pain.  You can also take the cough syrup as needed up to 4 times daily.  Do not drink or drive on this medication as it may make you drowsy.  Symptomatic measures for your sore throat include sleeping with a humidifier, tea with honey, and warm saline gargles.  The cough syrup should help as well as this will decrease irritation.  Please ensure you are drinking at least 64 ounces of water to stay hydrated.  You can also drink an electrolyte solution like Pedialyte, liquid IV or Gatorade.  Please return to clinic or follow-up with your primary care if you develop no improvement in symptoms over the next week or any worsening of condition.

## 2022-08-20 LAB — CULTURE, GROUP A STREP (THRC)

## 2022-08-21 LAB — CULTURE, GROUP A STREP (THRC)

## 2022-12-02 ENCOUNTER — Encounter (HOSPITAL_COMMUNITY): Payer: Self-pay

## 2022-12-02 ENCOUNTER — Ambulatory Visit (HOSPITAL_COMMUNITY)
Admission: EM | Admit: 2022-12-02 | Discharge: 2022-12-02 | Disposition: A | Discharge status: Home / Self Care | Payer: 59 | Attending: Emergency Medicine | Admitting: Emergency Medicine

## 2022-12-02 DIAGNOSIS — K0889 Other specified disorders of teeth and supporting structures: Secondary | ICD-10-CM | POA: Diagnosis not present

## 2022-12-02 LAB — POCT URINE PREGNANCY: Preg Test, Ur: NEGATIVE

## 2022-12-02 MED ORDER — IBUPROFEN 800 MG PO TABS: 800.0000 mg | ORAL_TABLET | Freq: Three times a day (TID) | ORAL | 0 refills | Status: DC

## 2022-12-02 MED ORDER — KETOROLAC TROMETHAMINE 30 MG/ML IJ SOLN
INTRAMUSCULAR | Status: AC
  Filled 2022-12-02: qty 1

## 2022-12-02 MED ORDER — AMOXICILLIN-POT CLAVULANATE 875-125 MG PO TABS: 1.0000 | ORAL_TABLET | Freq: Two times a day (BID) | ORAL | 0 refills | Status: DC

## 2022-12-02 MED ORDER — KETOROLAC TROMETHAMINE 30 MG/ML IJ SOLN
30.0000 mg | Freq: Once | INTRAMUSCULAR | Status: AC
  Administered 2022-12-02: 30 mg via INTRAMUSCULAR

## 2022-12-02 NOTE — ED Provider Notes (Signed)
[facility]    CSN: [id number] Arrival date & time: 12/02/22  0806      History   Chief Complaint Chief Complaint  Patient presents with   Dental Pain    HPI Selena Dawson is a 22 y.o. female.   Patient presents to clinic with left-sided lower dental pain that has been present for the past week, over the last 2 days her pain has gotten much worse.  She is having some swelling and reports her wisdom teeth is poking through.  She has been to have her with some teeth removed a while back, has not yet had this done.  Does not currently have a dentist that she sees.  Has not taken her temperature, reports she was in the lobby.  For her pain and swelling she is done salt water gargles, Orajel, mouthwash, Tylenol and ibuprofen.  She reports it is painful to eat and drink, has not really ate solid foods over the past 2 days due to the pain.    The history is provided by the patient and medical records.  Dental Pain Associated symptoms: no fever     History reviewed. No pertinent past medical history.  There are no problems to display for this patient.   History reviewed. No pertinent surgical history.  OB History   No obstetric history on file.      Home Medications    Prior to Admission medications   Medication Sig Start Date End Date Taking? Authorizing Provider  amoxicillin-clavulanate (AUGMENTIN) 875-125 MG tablet Take 1 tablet by mouth every 12 (twelve) hours. 12/02/22  Yes Rinaldo Ratel, [place], FNP  ibuprofen (ADVIL) 800 MG tablet Take 1 tablet (800 mg total) by mouth 3 (three) times daily. 12/02/22  Yes Alyce Inscore, [place] N, FNP    Family History Family History  Problem Relation Age of Onset   Diabetes Mother     Social History Social History   Tobacco Use   Smoking status: Never   Smokeless tobacco: Never  Vaping Use   Vaping status: Every Day   Substances: Nicotine, Flavoring  Substance Use Topics   Alcohol use: Yes    Comment: occasionally    Drug use: Yes    Types: Marijuana     Allergies   Patient has no known allergies.   Review of Systems Review of Systems  Constitutional:  Negative for fever.  HENT:  Positive for dental problem. Negative for trouble swallowing.      Physical Exam Triage Vital Signs ED Triage Vitals  Encounter Vitals Group     BP 12/02/22 0822 129/80     Systolic BP Percentile --      Diastolic BP Percentile --      Pulse Rate 12/02/22 0822 70     Resp 12/02/22 0822 16     Temp 12/02/22 0822 98.2 F (36.8 C)     Temp Source 12/02/22 0822 Oral     SpO2 12/02/22 0822 98 %     Weight --      Height 12/02/22 0822 5\' 3"  (1.6 m)     Head Circumference --      Peak Flow --      Pain Score 12/02/22 0821 10     Pain Loc --      Pain Education --      Exclude from Growth Chart --    No data found.  Updated Vital Signs BP 129/80 (BP Location: Right Arm)   Pulse 70   Temp  98.2 F (36.8 C) (Oral)   Resp 16   Ht 5\' 3"  (1.6 m)   LMP 10/04/2022 (Exact Date)   SpO2 98%   Visual Acuity Right Eye Distance:   Left Eye Distance:   Bilateral Distance:    Right Eye Near:   Left Eye Near:    Bilateral Near:     Physical Exam Vitals and nursing note reviewed.  Constitutional:      Appearance: Normal appearance.  HENT:     Head: Normocephalic and atraumatic.     Right Ear: External ear normal.     Left Ear: External ear normal.     Nose: Nose normal.     Mouth/Throat:     Mouth: Mucous membranes are moist.  Eyes:     Conjunctiva/sclera: Conjunctivae normal.  Cardiovascular:     Rate and Rhythm: Normal rate.  Pulmonary:     Effort: Pulmonary effort is normal. No respiratory distress.  Musculoskeletal:        General: Normal range of motion.     Cervical back: Normal range of motion.  Lymphadenopathy:     Cervical: Cervical adenopathy present.  Skin:    General: Skin is warm and dry.  Neurological:     General: No focal deficit present.     Mental Status: She is alert and  oriented to person, place, and time.  Psychiatric:        Mood and Affect: Mood normal.        Behavior: Behavior normal.      UC Treatments / Results  Labs (all labs ordered are listed, but only abnormal results are displayed) Labs Reviewed  POCT URINE PREGNANCY    EKG   Radiology No results found.  Procedures Procedures (including critical care time)  Medications Ordered in UC Medications  ketorolac (TORADOL) 30 MG/ML injection 30 mg (has no administration in time range)    Initial Impression / Assessment and Plan / UC Course  I have reviewed the triage vital signs and the nursing notes.  Pertinent labs & imaging results that were available during my care of the patient were reviewed by me and considered in my medical decision making (see chart for details).  Vitals and triage reviewed, patient is hemodynamically stable.  He has a protruding wisdom tooth to her left lower jaw with associated gum swelling and tenderness.  Offered IM Toradol in clinic for pain and inflammation, patient unsure if pregnant.  Urine pregnancy negative, IM Toradol, abx and dental resources given. POC, f/u care and return precautions given, no questions at this time.     Final Clinical Impressions(s) / UC Diagnoses   Final diagnoses:  Pain, dental     Discharge Instructions      Please take all antibiotics as prescribed and until finished, you can take them with food to help prevent gastrointestinal upset.  Tonight before you go to bed you can take ibuprofen, I suggest taking it with food.  After this he can take it every 8 hours.  It is imperative that you follow-up with a dentist for tooth removal, as her pain will continue until this tooth is removed.  Below are some dental resources.  Please return to clinic for any new or urgent symptoms.  [facility] [place] Address: [address] [place], [place], [place] [address] Phone: [phone]  [facility] [phone] extension [phone] [address]  Dr. Lawrence Marseilles [phone] [address]  [place] [phone]  [facility]  [facility] [phone] extension [phone], [place], [place], [address] First come first serve for the first 10 clients.  May do simple extractions only, no wisdom teeth or surgery.  You may try the second for Thursday of the month starting at 6:30 AM.  [employer] [facility] You may call the school to see if they are still helping to provide dental care for emergent cases.      ED Prescriptions     Medication Sig Dispense Auth. Provider   amoxicillin-clavulanate (AUGMENTIN) 875-125 MG tablet Take 1 tablet by mouth every 12 (twelve) hours. 14 tablet Rinaldo Ratel, [place], [place]   ibuprofen (ADVIL) 800 MG tablet Take 1 tablet (800 mg total) by mouth 3 (three) times daily. 21 tablet Quynn Vilchis, [place], [place]      PDMP not reviewed this encounter.   Rinaldo Ratel [place], [place] 12/02/22 [phone]

## 2022-12-02 NOTE — ED Triage Notes (Signed)
Patient here today with c/o left lower dental pain X 1 week. No trauma. She has taken Tylenol, Ibu, and Orajel with temporary relief.

## 2022-12-02 NOTE — Discharge Instructions (Addendum)
Please take all antibiotics as prescribed and until finished, you can take them with food to help prevent gastrointestinal upset.  Tonight before you go to bed you can take ibuprofen, I suggest taking it with food.  After this he can take it every 8 hours.  It is imperative that you follow-up with a dentist for tooth removal, as her pain will continue until this tooth is removed.  Below are some dental resources.  Please return to clinic for any new or urgent symptoms.  [facility] [place] Address: [address] [place], [place], [place] [address] Phone: [phone]  [facility] [phone] extension [phone] [address]  Dr. Lawrence Marseilles [phone] [address]  [place] [phone] [address] [facility].  [facility] [phone] extension [phone], [place], [place], [address] First come first serve for the first 10 clients.  May do simple extractions only, no wisdom teeth or surgery.  You may try the second for Thursday of the month starting at 6:30 AM.  [facility] [employer] [facility] You may call the school to see if they are still helping to provide dental care for emergent cases.

## 2023-04-12 ENCOUNTER — Ambulatory Visit (HOSPITAL_COMMUNITY): Payer: Self-pay

## 2023-04-12 ENCOUNTER — Ambulatory Visit
Admission: EM | Admit: 2023-04-12 | Discharge: 2023-04-12 | Disposition: A | Discharge status: Home / Self Care | Payer: 59 | Attending: Physician Assistant | Admitting: Physician Assistant

## 2023-04-12 DIAGNOSIS — Z3201 Encounter for pregnancy test, result positive: Secondary | ICD-10-CM | POA: Diagnosis not present

## 2023-04-12 LAB — POCT URINE PREGNANCY: Preg Test, Ur: POSITIVE — AB

## 2023-04-12 NOTE — ED Provider Notes (Signed)
[facility]    CSN: [id number] Arrival date & time: 04/12/23  0934      History   Chief Complaint Chief Complaint  Patient presents with   Possible Pregnancy    HPI Selena Dawson is a 22 y.o. female.   Patient here today for evaluation of missed period and confirmation of pregnancy.  She reports that she took pregnancy test on lower positive.  She denies any other questions or concerns.  The history is provided by the patient.  Possible Pregnancy Pertinent negatives include no abdominal pain and no shortness of breath.    History reviewed. No pertinent past medical history.  There are no problems to display for this patient.   History reviewed. No pertinent surgical history.  OB History   No obstetric history on file.      Home Medications    Prior to Admission medications   Medication Sig Start Date End Date Taking? Authorizing Provider  amoxicillin-clavulanate (AUGMENTIN) 875-125 MG tablet Take 1 tablet by mouth every 12 (twelve) hours. 12/02/22   Garrison, [place] N, FNP  ibuprofen (ADVIL) 800 MG tablet Take 1 tablet (800 mg total) by mouth 3 (three) times daily. 12/02/22   Garrison, [place] N, FNP    Family History Family History  Problem Relation Age of Onset   Diabetes Mother     Social History Social History   Tobacco Use   Smoking status: Never   Smokeless tobacco: Never  Vaping Use   Vaping status: Every Day   Substances: Nicotine, Flavoring  Substance Use Topics   Alcohol use: Yes    Comment: occasionally   Drug use: Yes    Types: Marijuana     Allergies   Patient has no known allergies.   Review of Systems Review of Systems  Constitutional:  Negative for chills and fever.  Eyes:  Negative for discharge and redness.  Respiratory:  Negative for shortness of breath.   Gastrointestinal:  Negative for abdominal pain, nausea and vomiting.  Genitourinary:  Positive for menstrual problem. Negative for vaginal bleeding.      Physical Exam Triage Vital Signs ED Triage Vitals  Encounter Vitals Group     BP 04/12/23 0945 103/66     Systolic BP Percentile --      Diastolic BP Percentile --      Pulse Rate 04/12/23 0945 73     Resp 04/12/23 0945 16     Temp 04/12/23 0945 98 F (36.7 C)     Temp Source 04/12/23 0945 Oral     SpO2 04/12/23 0945 100 %     Weight 04/12/23 0941 180 lb (81.6 kg)     Height 04/12/23 0941 5\' 3"  (1.6 m)     Head Circumference --      Peak Flow --      Pain Score 04/12/23 0941 0     Pain Loc --      Pain Education --      Exclude from Growth Chart --    No data found.  Updated Vital Signs BP 103/66 (BP Location: Left Arm)   Pulse 73   Temp 98 F (36.7 C) (Oral)   Resp 16   Ht 5\' 3"  (1.6 m)   Wt 180 lb (81.6 kg)   LMP 03/13/2023 (Exact Date)   SpO2 100%   BMI 31.89 kg/m   Visual Acuity Right Eye Distance:   Left Eye Distance:   Bilateral Distance:    Right Eye Near:  Left Eye Near:    Bilateral Near:     Physical Exam Vitals and nursing note reviewed.  Constitutional:      General: She is not in acute distress.    Appearance: Normal appearance. She is not ill-appearing.  HENT:     Head: Normocephalic and atraumatic.  Eyes:     Conjunctiva/sclera: Conjunctivae normal.  Cardiovascular:     Rate and Rhythm: Normal rate.  Pulmonary:     Effort: Pulmonary effort is normal. No respiratory distress.  Neurological:     Mental Status: She is alert.  Psychiatric:        Mood and Affect: Mood normal.        Behavior: Behavior normal.        Thought Content: Thought content normal.      UC Treatments / Results  Labs (all labs ordered are listed, but only abnormal results are displayed) Labs Reviewed  POCT URINE PREGNANCY - Abnormal; Notable for the following components:      Result Value   Preg Test, Ur Positive (*)    All other components within normal limits    EKG   Radiology No results found.  Procedures Procedures (including  critical care time)  Medications Ordered in UC Medications - No data to display  Initial Impression / Assessment and Plan / UC Course  I have reviewed the triage vital signs and the nursing notes.  Pertinent labs & imaging results that were available during my care of the patient were reviewed by me and considered in my medical decision making (see chart for details).    Pregnancy test in office was positive.  Recommended follow-up with OB as soon as possible and that she start taking prenatal vitamins daily.  Patient expressed understanding and has no further questions or concerns.  Final Clinical Impressions(s) / UC Diagnoses   Final diagnoses:  Positive pregnancy test   Discharge Instructions   None    ED Prescriptions   None    PDMP not reviewed this encounter.   Tomi Bamberger, PA-C 04/12/23 1012

## 2023-04-12 NOTE — ED Triage Notes (Signed)
"  I think I am pregnancy". LMP (03-13-2023). No other concerns.

## 2023-05-10 ENCOUNTER — Ambulatory Visit (HOSPITAL_COMMUNITY)
Admission: EM | Admit: 2023-05-10 | Discharge: 2023-05-10 | Disposition: A | Discharge status: Home / Self Care | Payer: Medicaid Other | Attending: Sports Medicine | Admitting: Sports Medicine

## 2023-05-10 ENCOUNTER — Encounter (HOSPITAL_COMMUNITY): Payer: Self-pay

## 2023-05-10 DIAGNOSIS — Z3A01 Less than 8 weeks gestation of pregnancy: Secondary | ICD-10-CM

## 2023-05-10 DIAGNOSIS — O219 Vomiting of pregnancy, unspecified: Secondary | ICD-10-CM

## 2023-05-10 LAB — POCT URINALYSIS DIP (MANUAL ENTRY)
Blood, UA: NEGATIVE
Glucose, UA: NEGATIVE mg/dL
Nitrite, UA: NEGATIVE
Protein Ur, POC: 100 mg/dL — AB
Spec Grav, UA: >=1.03 — AB
Urobilinogen, UA: 2 U/dL — AB
pH, UA: 5.5

## 2023-05-10 MED ORDER — SODIUM CHLORIDE 0.9 % IV BOLUS
1000.0000 mL | Freq: Once | INTRAVENOUS | Status: AC
  Administered 2023-05-10: 1000 mL via INTRAVENOUS

## 2023-05-10 MED ORDER — ONDANSETRON 4 MG PO TBDP
4.0000 mg | ORAL_TABLET | Freq: Once | ORAL | Status: AC
  Administered 2023-05-10: 4 mg via ORAL

## 2023-05-10 MED ORDER — VITAMIN B-6 25 MG PO TABS: 25.0000 mg | ORAL_TABLET | Freq: Four times a day (QID) | ORAL | 0 refills | Status: DC | PRN

## 2023-05-10 MED ORDER — ONDANSETRON HCL 4 MG PO TABS: 4.0000 mg | ORAL_TABLET | Freq: Four times a day (QID) | ORAL | 0 refills | Status: DC

## 2023-05-10 MED ORDER — ONDANSETRON 4 MG PO TBDP
ORAL_TABLET | ORAL | Status: AC
  Filled 2023-05-10: qty 1

## 2023-05-10 NOTE — Discharge Instructions (Addendum)
 Congratulations on your pregnancy. Nausea and vomiting is unfortunately common in the first trimester.  You are doing the right thing in trying to keep your diet bland.  Continue the prenatal vitamin with folic acid.  For nausea in pregnancy: First-line: Dietary changes (bland diet, protein rich meals, crackers in the morning, limiting liquids with meals). If nausea is persisting I have prescribed you oral vitamin B6 25mg  and you may take this with 25-50 mg of diphenhydramine  (benadryl  - which can be found over the counter at the pharmacy) up to every 8 hours. I have also sent you a short course of Zofran  to take only if the above does not improve your nausea first. This can be taken 8 hours apart.  It is important that you get in with an OB/GYN.  I have placed a referral in the chart for you and you should hear about getting this scheduled in the next few weeks.  If these do not control your nausea, next step would be to go to the ER for possible admission.

## 2023-05-10 NOTE — ED Provider Notes (Signed)
 [facility]    CSN: [id number] Arrival date & time: 05/10/23  1058      History   Chief Complaint Chief Complaint  Patient presents with   Emesis    HPI Selena Dawson is a 23 y.o. G1 female who is [redacted] weeks pregnant here for refractory nausea and vomiting over the past 5 days. She has been unable to tolerate any PO other than a few sips of water . She denies abdominal pain or fevers. She has tried making her diet bland with crackers and even this is not tolerable. She reports she has started her PNV since last UC visit, though has yet to establish with OB.   Emesis  History reviewed. No pertinent past medical history.  There are no active problems to display for this patient.  History reviewed. No pertinent surgical history.  OB History   No obstetric history on file.     Home Medications    Prior to Admission medications   Medication Sig Start Date End Date Taking? Authorizing Provider  ondansetron  (ZOFRAN ) 4 MG tablet Take 1 tablet (4 mg total) by mouth every 6 (six) hours. 05/10/23  Yes Shonta Phillis D, MD  pyridOXINE (VITAMIN B6) 25 MG tablet Take 1 tablet (25 mg total) by mouth 4 (four) times daily as needed (Nausea). 05/10/23  Yes Levorn Prentice BIRCH, MD    Family History Family History  Problem Relation Age of Onset   Diabetes Mother     Social History Social History   Tobacco Use   Smoking status: Never   Smokeless tobacco: Never  Vaping Use   Vaping status: Every Day   Substances: Nicotine, Flavoring  Substance Use Topics   Alcohol use: Yes    Comment: occasionally   Drug use: Yes    Types: Marijuana     Allergies   Patient has no known allergies.   Review of Systems Review of Systems  Gastrointestinal:  Positive for vomiting.    Physical Exam Triage Vital Signs ED Triage Vitals  Encounter Vitals Group     BP 05/10/23 1219 (!) 119/54     Systolic BP Percentile --      Diastolic BP Percentile --      Pulse Rate 05/10/23 1219 83      Resp 05/10/23 1219 18     Temp 05/10/23 1219 98.4 F (36.9 C)     Temp Source 05/10/23 1219 Oral     SpO2 05/10/23 1219 97 %     Weight 05/10/23 1250 160 lb 6.4 oz (72.8 kg)     Height --      Head Circumference --      Peak Flow --      Pain Score 05/10/23 1222 0     Pain Loc --      Pain Education --      Exclude from Growth Chart --    Updated Vital Signs BP 119/74 (BP Location: Left Arm)   Pulse 76   Temp 98.4 F (36.9 C) (Oral)   Resp 18   Wt 72.8 kg   LMP 03/13/2023 (Exact Date)   SpO2 98%   BMI 28.41 kg/m   Physical Exam Constitutional:      General: She is not in acute distress.    Appearance: She is normal weight. She is ill-appearing. She is not toxic-appearing.  HENT:     Head: Normocephalic and atraumatic.     Mouth/Throat:     Mouth: Mucous membranes are moist.  Pharynx: No oropharyngeal exudate or posterior oropharyngeal erythema.  Eyes:     Extraocular Movements: Extraocular movements intact.     Pupils: Pupils are equal, round, and reactive to light.  Cardiovascular:     Rate and Rhythm: Normal rate.     Pulses: Normal pulses.     Heart sounds: No murmur heard. Pulmonary:     Effort: Pulmonary effort is normal. No respiratory distress.  Abdominal:     General: Abdomen is flat. There is no distension.     Palpations: Abdomen is soft.     Tenderness: There is no abdominal tenderness. There is no right CVA tenderness, left CVA tenderness, guarding or rebound.  Musculoskeletal:     Cervical back: Normal range of motion and neck supple.  Skin:    General: Skin is warm.     Capillary Refill: Capillary refill takes 2 to 3 seconds.     Findings: No rash.  Neurological:     General: No focal deficit present.     Mental Status: She is alert and oriented to person, place, and time.  Psychiatric:        Mood and Affect: Mood normal.        Behavior: Behavior normal.      UC Treatments / Results  Labs (all labs ordered are listed, but only  abnormal results are displayed) Labs Reviewed  POCT URINALYSIS DIP (MANUAL ENTRY) - Abnormal; Notable for the following components:      Result Value   Bilirubin, UA small (*)    Ketones, POC UA small (15) (*)    Spec Grav, UA >=1.030 (*)    Protein Ur, POC =100 (*)    Urobilinogen, UA 2.0 (*)    Leukocytes, UA Trace (*)    All other components within normal limits    EKG   Radiology No results found.  Procedures Procedures (including critical care time)  Medications Ordered in UC Medications  sodium chloride  0.9 % bolus 1,000 mL (1,000 mLs Intravenous New Bag/Given 05/10/23 1256)  ondansetron  (ZOFRAN -ODT) disintegrating tablet 4 mg (4 mg Oral Given 05/10/23 1249)    Initial Impression / Assessment and Plan / UC Course  I have reviewed the triage vital signs and the nursing notes.  Pertinent labs & imaging results that were available during my care of the patient were reviewed by me and considered in my medical decision making (see chart for details).  Clinical Course as of 05/10/23 1322  Wed May 10, 2023  1229 POC urinalysis dipstick [AC]    Clinical Course User Index [AC] Levorn Prentice BIRCH, MD    Vitals and triage reviewed, patient is hemodynamically stable.  She does have mild ketonuria and has lost 22 pounds since her last visit here which is nearly 10% of her body weight.  She meets criteria for hyperemesis gravidarum without having tried other therapies I am hesitant to call at this at this point.  Her exam is fairly benign.  Given her intolerance to p.o. I did give her a dose of p.o. Zofran  and 1 L of normal saline in urgent care today.  Upon reexamination she was feeling much better.  I prescribed her vitamin B6 25 mg to take up to every 8 hours as well as diphenhydramine  25 mg to take up to every 8 hours as a first-line therapy for her nausea.  I did prescribe her a small amount Zofran  and advised her to only take this for refractory nausea and vomiting that is  unimproved with the above regimen.  I have also referred her to OB to establish this pregnancy.  Patient's questions were answered and she is in agreement this plan.  Final Clinical Impressions(s) / UC Diagnoses   Final diagnoses:  Nausea/vomiting in pregnancy     Discharge Instructions      Congratulations on your pregnancy. Nausea and vomiting is unfortunately common in the first trimester.  You are doing the right thing in trying to keep your diet bland.  Continue the prenatal vitamin with folic acid.  For nausea in pregnancy: First-line: Dietary changes (bland diet, protein rich meals, crackers in the morning, limiting liquids with meals). If nausea is persisting I have prescribed you oral vitamin B6 25mg  and you may take this with 25-50 mg of diphenhydramine  (benadryl  - which can be found over the counter at the pharmacy) up to every 8 hours. I have also sent you a short course of Zofran  to take only if the above does not improve your nausea first. This can be taken 8 hours apart.  It is important that you get in with an OB/GYN.  I have placed a referral in the chart for you and you should hear about getting this scheduled in the next few weeks.  If these do not control your nausea, next step would be to go to the ER for possible admission.   ED Prescriptions     Medication Sig Dispense Auth. Provider   pyridOXINE (VITAMIN B6) 25 MG tablet Take 1 tablet (25 mg total) by mouth 4 (four) times daily as needed (Nausea). 90 tablet Capricia Serda D, MD   ondansetron  (ZOFRAN ) 4 MG tablet Take 1 tablet (4 mg total) by mouth every 6 (six) hours. 12 tablet Edric Fetterman D, MD      PDMP not reviewed this encounter.   Levorn Prentice BIRCH, MD 05/10/23 1328

## 2023-05-10 NOTE — ED Triage Notes (Signed)
 Pt states she is [redacted] wks pregnant and unable to keep liquids or solids down since 01/03. Doesn't have a OBGYN at this time.

## 2023-05-15 ENCOUNTER — Inpatient Hospital Stay (HOSPITAL_COMMUNITY)
Admission: AD | Admit: 2023-05-15 | Discharge: 2023-05-15 | Disposition: A | Discharge status: Home / Self Care | Payer: 59 | Attending: Obstetrics & Gynecology | Admitting: Obstetrics & Gynecology

## 2023-05-15 ENCOUNTER — Encounter (HOSPITAL_COMMUNITY): Payer: Self-pay

## 2023-05-15 DIAGNOSIS — O210 Mild hyperemesis gravidarum: Secondary | ICD-10-CM | POA: Diagnosis not present

## 2023-05-15 DIAGNOSIS — F1210 Cannabis abuse, uncomplicated: Secondary | ICD-10-CM

## 2023-05-15 DIAGNOSIS — O219 Vomiting of pregnancy, unspecified: Secondary | ICD-10-CM | POA: Insufficient documentation

## 2023-05-15 DIAGNOSIS — Z3A09 9 weeks gestation of pregnancy: Secondary | ICD-10-CM | POA: Diagnosis not present

## 2023-05-15 DIAGNOSIS — O26891 Other specified pregnancy related conditions, first trimester: Secondary | ICD-10-CM | POA: Diagnosis not present

## 2023-05-15 LAB — URINALYSIS, ROUTINE W REFLEX MICROSCOPIC
Bilirubin Urine: NEGATIVE
Glucose, UA: NEGATIVE mg/dL
Hgb urine dipstick: NEGATIVE
Ketones, ur: NEGATIVE mg/dL
Nitrite: NEGATIVE
Protein, ur: NEGATIVE mg/dL
Specific Gravity, Urine: 1.026 (ref 1.005–1.030)
Squamous Epithelial / HPF: >50 /[HPF] (ref 0–5)
pH: 5 (ref 5.0–8.0)

## 2023-05-15 LAB — COMPREHENSIVE METABOLIC PANEL
ALT: 11 U/L (ref 0–44)
AST: 12 U/L — ABNORMAL LOW (ref 15–41)
Albumin: 3.6 g/dL (ref 3.5–5.0)
Alkaline Phosphatase: 39 U/L (ref 38–126)
Anion gap: 5 (ref 5–15)
BUN: 7 mg/dL (ref 6–20)
CO2: 23 mmol/L (ref 22–32)
Calcium: 8.7 mg/dL — ABNORMAL LOW (ref 8.9–10.3)
Chloride: 105 mmol/L (ref 98–111)
Creatinine, Ser: 0.54 mg/dL (ref 0.44–1.00)
GFR, Estimated: >60 mL/min (ref >60)
Glucose, Bld: 94 mg/dL (ref 70–99)
Potassium: 4.2 mmol/L (ref 3.5–5.1)
Sodium: 133 mmol/L — ABNORMAL LOW (ref 135–145)
Total Bilirubin: 0.3 mg/dL (ref 0.0–1.2)
Total Protein: 6.9 g/dL (ref 6.5–8.1)

## 2023-05-15 LAB — CBC
HCT: 31.9 % — ABNORMAL LOW (ref 36.0–46.0)
Hemoglobin: 10.7 g/dL — ABNORMAL LOW (ref 12.0–15.0)
MCH: 28.2 pg (ref 26.0–34.0)
MCHC: 33.5 g/dL (ref 30.0–36.0)
MCV: 83.9 fL (ref 80.0–100.0)
Platelets: 269 10*3/uL (ref 150–400)
RBC: 3.8 MIL/uL — ABNORMAL LOW (ref 3.87–5.11)
RDW: 13 % (ref 11.5–15.5)
WBC: 6.6 10*3/uL (ref 4.0–10.5)
nRBC: 0 % (ref 0.0–0.2)

## 2023-05-15 MED ORDER — DIPHENHYDRAMINE HCL 50 MG/ML IJ SOLN
25.0000 mg | Freq: Once | INTRAMUSCULAR | Status: AC
  Administered 2023-05-15: 25 mg via INTRAMUSCULAR
  Filled 2023-05-15: qty 1

## 2023-05-15 MED ORDER — ONDANSETRON 4 MG PO TBDP: 8.0000 mg | ORAL_TABLET | Freq: Once | ORAL | Status: DC

## 2023-05-15 MED ORDER — ALUM & MAG HYDROXIDE-SIMETH 200-200-20 MG/5ML PO SUSP
30.0000 mL | Freq: Once | ORAL | Status: AC
  Administered 2023-05-15: 30 mL via ORAL
  Filled 2023-05-15: qty 30

## 2023-05-15 MED ORDER — PROMETHAZINE HCL 12.5 MG PO TABS: 12.5000 mg | ORAL_TABLET | Freq: Four times a day (QID) | ORAL | 0 refills | Status: DC | PRN

## 2023-05-15 MED ORDER — PROMETHAZINE HCL 25 MG PO TABS
25.0000 mg | ORAL_TABLET | Freq: Once | ORAL | Status: AC
  Administered 2023-05-15: 25 mg via ORAL
  Filled 2023-05-15: qty 1

## 2023-05-15 NOTE — Discharge Instructions (Signed)

## 2023-05-15 NOTE — MAU Note (Signed)
..  Selena Dawson is a 23 y.o. at [redacted]w[redacted]d here in MAU reporting: ongoing nausea and vomiting. States she's been unable to keep anything down for the last x2 weeks. Was prescribed zofran  tablets at UC the other day. States she has been taking them here and there but she was concerned when she threw up this morning and there was streaks of blood in her emesis. Denies VB or abdominal pain. No diarrhea.   Pain score: 0 Vitals:   05/15/23 1024  BP: (!) 109/54  Pulse: 79  Resp: 16  Temp: 98.6 F (37 C)  SpO2: 98%      Lab orders placed from triage:   UA

## 2023-05-15 NOTE — MAU Provider Note (Signed)
 Event Date/Time   First Provider Initiated Contact with Patient 05/15/23 1057      S Ms. Ruthellen Tippy is a 23 y.o. G1P0 pregnant female at [redacted]w[redacted]d who presents to MAU today with complaint of N/V with bloody tinged emesis. Pt states for 2 weeks she's had poor PO intake, was prescribed Zofran  (last dose 0800) but little relief and believes she threw up this morning's dose.  Denies VB, abdominal pain, no diarrhea.  She does described lightheadedness intermittently. States last THC ingestion was 2 weeks ago.   Pertinent items noted in HPI and remainder of comprehensive ROS otherwise negative.   O BP (!) 109/54 (BP Location: Right Arm)   Pulse 79   Temp 98.6 F (37 C) (Oral)   Resp 16   Wt 75.2 kg   LMP 03/13/2023 (Exact Date)   SpO2 98%   BMI 29.37 kg/m  Physical Exam Vitals and nursing note reviewed.  Constitutional:      General: She is not in acute distress.    Appearance: Normal appearance. She is ill-appearing.     Comments: Cannabis odor evident   HENT:     Head: Normocephalic and atraumatic.     Right Ear: External ear normal.     Left Ear: External ear normal.     Nose: Nose normal.     Mouth/Throat:     Mouth: Mucous membranes are moist.     Pharynx: Oropharynx is clear.  Eyes:     Extraocular Movements: Extraocular movements intact.     Conjunctiva/sclera: Conjunctivae normal.  Cardiovascular:     Rate and Rhythm: Normal rate.     Heart sounds: No murmur heard.    No friction rub. No gallop.  Pulmonary:     Effort: Pulmonary effort is normal. No respiratory distress.     Breath sounds: Normal breath sounds.  Abdominal:     General: Abdomen is flat. There is no distension.     Palpations: Abdomen is soft.  Musculoskeletal:        General: Normal range of motion.     Cervical back: Normal range of motion.  Skin:    General: Skin is warm and dry.  Neurological:     Mental Status: She is alert and oriented to person, place, and time. Mental status is at baseline.   Psychiatric:        Mood and Affect: Mood normal.        Behavior: Behavior normal.      MDM: MAU Course: Upreg positive 05/10/23 CMP reassuring, Cr 0.54 CBC no leukocytosis, Hgb 10.7 UA moderate LE, many bacteria will send for OB Ucx   Feeling better after IM Benadryl  and Phenergan , able to tolerate PO. Stable for d/c.    A/P #[redacted] weeks gestation #Nausea/Vomiting  - f/u OB Ucx  - send home with phenergan  px  Discharge from MAU in stable condition with strict/usual precautions Follow up at desired OBGYN as scheduled for ongoing prenatal care  Allergies as of 05/15/2023   No Known Allergies      Medication List     TAKE these medications    ondansetron  4 MG tablet Commonly known as: ZOFRAN  Take 1 tablet (4 mg total) by mouth every 6 (six) hours.   promethazine  12.5 MG tablet Commonly known as: PHENERGAN  Take 1 tablet (12.5 mg total) by mouth every 6 (six) hours as needed for nausea or vomiting.   pyridOXINE 25 MG tablet Commonly known as: VITAMIN B6 Take 1 tablet (25 mg  total) by mouth 4 (four) times daily as needed (Nausea).        Jhonny Augustin BROCKS, MD 05/15/2023 12:22 PM

## 2023-05-16 LAB — CULTURE, OB URINE: Special Requests: NORMAL

## 2023-05-30 ENCOUNTER — Encounter (HOSPITAL_COMMUNITY): Payer: Self-pay | Admitting: Obstetrics and Gynecology

## 2023-05-30 ENCOUNTER — Inpatient Hospital Stay (HOSPITAL_COMMUNITY)
Admission: AD | Admit: 2023-05-30 | Discharge: 2023-05-30 | Disposition: A | Discharge status: Home / Self Care | Payer: Medicaid Other | Attending: Obstetrics and Gynecology | Admitting: Obstetrics and Gynecology

## 2023-05-30 DIAGNOSIS — Z3A11 11 weeks gestation of pregnancy: Secondary | ICD-10-CM

## 2023-05-30 DIAGNOSIS — O26891 Other specified pregnancy related conditions, first trimester: Secondary | ICD-10-CM | POA: Diagnosis present

## 2023-05-30 DIAGNOSIS — O210 Mild hyperemesis gravidarum: Secondary | ICD-10-CM | POA: Diagnosis not present

## 2023-05-30 LAB — URINALYSIS, ROUTINE W REFLEX MICROSCOPIC
Bilirubin Urine: NEGATIVE
Glucose, UA: NEGATIVE mg/dL
Hgb urine dipstick: NEGATIVE
Ketones, ur: 5 mg/dL — AB
Leukocytes,Ua: NEGATIVE
Nitrite: NEGATIVE
Protein, ur: NEGATIVE mg/dL
Specific Gravity, Urine: 1.017 (ref 1.005–1.030)
pH: 7 (ref 5.0–8.0)

## 2023-05-30 MED ORDER — ONDANSETRON 4 MG PO TBDP
8.0000 mg | ORAL_TABLET | Freq: Once | ORAL | Status: AC
  Administered 2023-05-30: 8 mg via ORAL
  Filled 2023-05-30: qty 2

## 2023-05-30 MED ORDER — ONDANSETRON 4 MG PO TBDP: 4.0000 mg | ORAL_TABLET | Freq: Three times a day (TID) | ORAL | 0 refills | Status: DC | PRN

## 2023-05-30 NOTE — MAU Note (Addendum)
...  Selena Dawson is a 23 y.o. at [redacted]w[redacted]d here in MAU reporting: Two episodes of emesis this morning. She reports prior to today she has not needed her medications. She reports on her first episode of emesis she noted blood with vomiting and this alarmed her. Out of her Zofran at home for 2-3 days now. Denies VB.   Has not picked up her Phenergan and is not taking her B6.  Onset of complaint: this morning Pain score: Denies pain.  Vitals:   05/30/23 1339  BP: 125/65  Pulse: 76  Resp: 16  Temp: 98.2 F (36.8 C)  SpO2: 100%     FHT:161 doppler Lab orders placed from triage: UA

## 2023-05-30 NOTE — MAU Provider Note (Signed)
History     CSN: [id number]  Arrival date and time: 05/30/23 1327   Event Date/Time   First Provider Initiated Contact with Patient 05/30/23 1501      Chief Complaint  Patient presents with   Nausea   HPI Ms. Selena Dawson is a 23 y.o. year old G1P0 female at [redacted]w[redacted]d weeks gestation who presents to MAU reporting 2 episodes of vomiting this morning. She has not needed to take any of her anti-nausea medications until today. She normally only takes Zofran, but ran out 2-3 days ago. She noticed blood in the 1st vomit she has this morning and that alarmed her. She denies VB. She plans to receive [facility] with [facility]; next appt is 06/12/2023.    OB History     Gravida  1   Para      Term      Preterm      AB      Living         SAB      IAB      Ectopic      Multiple      Live Births              History reviewed. No pertinent past medical history.  History reviewed. No pertinent surgical history.  Family History  Problem Relation Age of Onset   Diabetes Mother     Social History   Tobacco Use   Smoking status: Never   Smokeless tobacco: Never  Vaping Use   Vaping status: Every Day   Substances: Nicotine, Flavoring  Substance Use Topics   Alcohol use: Yes    Comment: occasionally   Drug use: Yes    Types: Marijuana    Allergies: No Known Allergies  Medications Prior to Admission  Medication Sig Dispense Refill Last Dose/Taking   ondansetron (ZOFRAN) 4 MG tablet Take 1 tablet (4 mg total) by mouth every 6 (six) hours. 12 tablet 0    promethazine (PHENERGAN) 12.5 MG tablet Take 1 tablet (12.5 mg total) by mouth every 6 (six) hours as needed for nausea or vomiting. 30 tablet 0    pyridOXINE (VITAMIN B6) 25 MG tablet Take 1 tablet (25 mg total) by mouth 4 (four) times daily as needed (Nausea). 90 tablet 0     Review of Systems  Constitutional: Negative.   HENT: Negative.    Eyes: Negative.   Respiratory: Negative.    Cardiovascular: Negative.    Gastrointestinal:  Positive for nausea and vomiting (saw a small amt of blood in the 1st vomit this morning).  Endocrine: Negative.   Genitourinary: Negative.   Musculoskeletal: Negative.   Skin: Negative.   Allergic/Immunologic: Negative.   Neurological: Negative.   Hematological: Negative.   Psychiatric/Behavioral: Negative.     Physical Exam   Blood pressure 125/65, pulse 76, temperature 98.2 F (36.8 C), temperature source Oral, resp. rate 16, height 5\' 3"  (1.6 m), weight 76.2 kg, last menstrual period 03/13/2023, SpO2 100%.  Physical Exam Vitals and nursing note reviewed.  Constitutional:      Appearance: Normal appearance. She is normal weight.  Cardiovascular:     Rate and Rhythm: Normal rate.  Pulmonary:     Effort: Pulmonary effort is normal.  Genitourinary:    Comments: Not indicated Musculoskeletal:        General: Normal range of motion.  Neurological:     Mental Status: She is alert and oriented to person, place, and time.  Psychiatric:  Mood and Affect: Mood normal.        Behavior: Behavior normal.        Thought Content: Thought content normal.        Judgment: Judgment normal.    FHTs by doppler: 161 bpm  MAU Course  Procedures  MDM CCUA  Results for orders placed or performed during the hospital encounter of 05/30/23 (from the past 24 hours)  Urinalysis, Routine w reflex microscopic -Urine, Clean Catch     Status: Abnormal   Collection Time: 05/30/23  1:56 PM  Result Value Ref Range   Color, Urine YELLOW YELLOW   APPearance HAZY (A) CLEAR   Specific Gravity, Urine 1.017 1.005 - 1.030   pH 7.0 5.0 - 8.0   Glucose, UA NEGATIVE NEGATIVE mg/dL   Hgb urine dipstick NEGATIVE NEGATIVE   Bilirubin Urine NEGATIVE NEGATIVE   Ketones, ur 5 (A) NEGATIVE mg/dL   Protein, ur NEGATIVE NEGATIVE mg/dL   Nitrite NEGATIVE NEGATIVE   Leukocytes,Ua NEGATIVE NEGATIVE   Assessment and Plan  1. Morning sickness (Primary) - prescription for: Zofran 4 mg  ODT every 8 hrs prn nausea  2. [redacted] weeks gestation of pregnancy   - Discharge patient - Keep scheduled appt with [facility] on 06/12/2023 - Patient verbalized an understanding of the plan of care and agrees.   Raelyn Mora, CNM 05/30/2023, 3:01 PM

## 2023-06-12 ENCOUNTER — Other Ambulatory Visit (HOSPITAL_COMMUNITY)
Admission: RE | Admit: 2023-06-12 | Discharge: 2023-06-12 | Disposition: A | Discharge status: Home / Self Care | Payer: Medicaid Other | Source: Ambulatory Visit | Attending: Obstetrics and Gynecology | Admitting: Obstetrics and Gynecology

## 2023-06-12 ENCOUNTER — Other Ambulatory Visit: Payer: Self-pay

## 2023-06-12 ENCOUNTER — Encounter: Payer: Self-pay | Admitting: Obstetrics and Gynecology

## 2023-06-12 ENCOUNTER — Ambulatory Visit (INDEPENDENT_AMBULATORY_CARE_PROVIDER_SITE_OTHER): Payer: Medicaid Other | Admitting: Obstetrics and Gynecology

## 2023-06-12 VITALS — BP 125/73 | HR 94 | Wt 169.9 lb

## 2023-06-12 DIAGNOSIS — Z3A13 13 weeks gestation of pregnancy: Secondary | ICD-10-CM

## 2023-06-12 DIAGNOSIS — O99210 Obesity complicating pregnancy, unspecified trimester: Secondary | ICD-10-CM | POA: Insufficient documentation

## 2023-06-12 DIAGNOSIS — Z3401 Encounter for supervision of normal first pregnancy, first trimester: Secondary | ICD-10-CM | POA: Insufficient documentation

## 2023-06-12 DIAGNOSIS — Z3490 Encounter for supervision of normal pregnancy, unspecified, unspecified trimester: Secondary | ICD-10-CM

## 2023-06-12 DIAGNOSIS — Z3482 Encounter for supervision of other normal pregnancy, second trimester: Secondary | ICD-10-CM

## 2023-06-12 DIAGNOSIS — O99211 Obesity complicating pregnancy, first trimester: Secondary | ICD-10-CM | POA: Diagnosis not present

## 2023-06-12 MED ORDER — VITAFOL GUMMIES 3.33-0.333-34.8 MG PO CHEW
2.0000 | CHEWABLE_TABLET | Freq: Every day | ORAL | 6 refills | Status: AC
Start: 2023-06-12 — End: 2023-07-12

## 2023-06-12 MED ORDER — PANTOPRAZOLE SODIUM 40 MG PO TBEC
40.0000 mg | DELAYED_RELEASE_TABLET | Freq: Every day | ORAL | 3 refills | Status: DC
Start: 2023-06-12 — End: 2023-09-19

## 2023-06-12 NOTE — Progress Notes (Signed)
  Subjective:    Selena Dawson is a G1P0 [redacted]w[redacted]d being seen today for her first obstetrical visit.  Her obstetrical history is significant for first pregnancy. Patient does intend to breast feed. Pregnancy history fully reviewed.  Patient reports heartburn and nausea.  Vitals:   06/12/23 1315  BP: 125/73  Pulse: 94  Weight: 169 lb 14.4 oz (77.1 kg)    HISTORY: OB History  Gravida Para Term Preterm AB Living  1       SAB IAB Ectopic Multiple Live Births          # Outcome Date GA Lbr Len/2nd Weight Sex Type Anes PTL Lv  1 Current            No past medical history on file. No past surgical history on file. Family History  Problem Relation Age of Onset   Diabetes Mother      Exam    Uterus:   12-weeks in size  Pelvic Exam:    Perineum: No Hemorrhoids, Normal Perineum   Vulva: normal   Vagina:  normal mucosa, normal discharge   pH:    Cervix: nulliparous appearance and cervix is closed and long   Adnexa: normal adnexa and no mass, fullness, tenderness   Bony Pelvis: gynecoid  System: Breast:  normal appearance, no masses or tenderness   Skin: normal coloration and turgor, no rashes    Neurologic: oriented, no focal deficits   Extremities: normal strength, tone, and muscle mass   HEENT extra ocular movement intact   Mouth/Teeth mucous membranes moist, pharynx normal without lesions and dental hygiene good   Neck supple and no masses   Cardiovascular: regular rate and rhythm   Respiratory:  appears well, vitals normal, no respiratory distress, acyanotic, normal RR, chest clear, no wheezing, crepitations, rhonchi, normal symmetric air entry   Abdomen: soft, non-tender; bowel sounds normal; no masses,  no organomegaly   Urinary:       Assessment:    Pregnancy: G1P0 Patient Active Problem List   Diagnosis Date Noted   Encounter for supervision of normal first pregnancy in first trimester 06/12/2023   Maternal obesity affecting pregnancy, antepartum 06/12/2023         Plan:     Initial labs drawn. Prenatal vitamins. Problem list reviewed and updated. Genetic Screening discussed : ordered.  Ultrasound discussed; fetal survey: ordered. Rx anti-acid provided Patient interested in Doula- referral placed Patient is considering waterbirth- information provided  Follow up in 4 weeks. 50% of 30 min visit spent on counseling and coordination of care.     Selena Dawson 06/12/2023

## 2023-06-12 NOTE — Progress Notes (Signed)
 ROB: denies any concerns

## 2023-06-12 NOTE — Patient Instructions (Signed)
   Considering Waterbirth? Guide for patients at [facility]) Why consider waterbirth? Gentle birth for babies  Less pain medicine used in labor  May allow for passive descent/less pushing  May reduce perineal tears  More mobility and instinctive maternal position changes  Increased maternal relaxation   Is waterbirth safe? What are the risks of infection, drowning or other complications? Infection:  Very low risk (3.7 % for tub vs 4.8% for bed)  7 in 8000 waterbirths with documented infection  Poorly cleaned equipment most common cause  Slightly lower group B strep transmission rate  Drowning  Maternal:  Very low risk  Related to seizures or fainting  Newborn:  Very low risk. No evidence of increased risk of respiratory problems in multiple large studies  Physiological protection from breathing under water  Avoid underwater birth if there are any fetal complications  Once baby's head is out of the water, keep it out.  Birth complication  Some reports of cord trauma, but risk decreased by bringing baby to surface gradually  No evidence of increased risk of shoulder dystocia. Mothers can usually change positions faster in water than in a bed, possibly aiding the maneuvers to free the shoulder.   There are 2 things you MUST do to have a waterbirth with [facility]: Attend a waterbirth class at [facility] at [facility]   3rd Wednesday of every month from 7-9 pm (virtual during COVID) [employer] at [email or web address] or [email or web address] or by calling [phone] Bring [place] the certificate from the class to your prenatal appointment or send via MyChart Meet with a midwife at 36 weeks* to see if you can still plan a waterbirth and to sign the consent.   *We also recommend that you schedule as many of your prenatal visits with a midwife as possible.    Helpful information: You may want to bring a bathing suit top to the hospital  to wear during labor but this is optional.  All other supplies are provided by the hospital. Please arrive at the hospital with signs of active labor, and do not wait at home until late in labor. It takes 45 min- 1 hour for fetal monitoring, and check in to your room to take place, plus transport and filling of the waterbirth tub.    Things that would prevent you from having a waterbirth: Premature, <37wks  Previous cesarean birth  Presence of thick meconium-stained fluid  Multiple gestation (Twins, triplets, etc.)  Uncontrolled diabetes or gestational diabetes requiring medication  Hypertension diagnosed in pregnancy or preexisting hypertension (gestational hypertension, preeclampsia, or chronic hypertension) Fetal growth restriction (your baby measures less than 10th percentile on ultrasound) Heavy vaginal bleeding  Non-reassuring fetal heart rate  Active infection (MRSA, etc.). Group B Strep is NOT a contraindication for waterbirth.  If your labor has to be induced and induction method requires continuous monitoring of the baby's heart rate  Other risks/issues identified by your obstetrical provider   Please remember that birth is unpredictable. Under certain unforeseeable circumstances your provider may advise against giving birth in the tub. These decisions will be made on a case-by-case basis and with the safety of you and your baby as our highest priority.    Updated 08/04/21

## 2023-06-13 LAB — CBC/D/PLT+RPR+RH+ABO+RUBIGG...
Antibody Screen: NEGATIVE
Basophils Absolute: 0 10*3/uL (ref 0.0–0.2)
Basos: 0 %
EOS (ABSOLUTE): 0 10*3/uL (ref 0.0–0.4)
Eos: 0 %
HCV Ab: NONREACTIVE
HIV Screen 4th Generation wRfx: NONREACTIVE
Hematocrit: 31.3 % — ABNORMAL LOW (ref 34.0–46.6)
Hemoglobin: 10.1 g/dL — ABNORMAL LOW (ref 11.1–15.9)
Hepatitis B Surface Ag: NEGATIVE
Immature Grans (Abs): 0 10*3/uL (ref 0.0–0.1)
Immature Granulocytes: 0 %
Lymphocytes Absolute: 2 10*3/uL (ref 0.7–3.1)
Lymphs: 30 %
MCH: 28.4 pg (ref 26.6–33.0)
MCHC: 32.3 g/dL (ref 31.5–35.7)
MCV: 88 fL (ref 79–97)
Monocytes Absolute: 0.6 10*3/uL (ref 0.1–0.9)
Monocytes: 9 %
Neutrophils Absolute: 4.1 10*3/uL (ref 1.4–7.0)
Neutrophils: 61 %
Platelets: 251 10*3/uL (ref 150–450)
RBC: 3.56 x10E6/uL — ABNORMAL LOW (ref 3.77–5.28)
RDW: 13.3 % (ref 11.7–15.4)
RPR Ser Ql: NONREACTIVE
Rh Factor: POSITIVE
Rubella Antibodies, IGG: 2.02 {index} (ref >0.99)
WBC: 6.8 10*3/uL (ref 3.4–10.8)

## 2023-06-13 LAB — HEMOGLOBIN A1C
Est. average glucose Bld gHb Est-mCnc: 105 mg/dL
Hgb A1c MFr Bld: 5.3 % (ref 4.8–5.6)

## 2023-06-13 LAB — HCV INTERPRETATION

## 2023-06-14 ENCOUNTER — Other Ambulatory Visit: Payer: Self-pay

## 2023-06-14 DIAGNOSIS — Z3401 Encounter for supervision of normal first pregnancy, first trimester: Secondary | ICD-10-CM

## 2023-06-14 DIAGNOSIS — O99210 Obesity complicating pregnancy, unspecified trimester: Secondary | ICD-10-CM

## 2023-06-17 LAB — PANORAMA PRENATAL TEST FULL PANEL:PANORAMA TEST PLUS 5 ADDITIONAL MICRODELETIONS: FETAL FRACTION: 12.4

## 2023-06-19 LAB — CYTOLOGY - PAP
Chlamydia: NEGATIVE
Comment: NEGATIVE
Comment: NEGATIVE
Comment: NEGATIVE
Comment: NEGATIVE
Comment: NORMAL
Diagnosis: UNDETERMINED — AB
HPV 16: NEGATIVE
HPV 18 / 45: NEGATIVE
High risk HPV: POSITIVE — AB
Neisseria Gonorrhea: NEGATIVE

## 2023-06-21 ENCOUNTER — Encounter: Payer: Self-pay | Admitting: Obstetrics and Gynecology

## 2023-06-21 ENCOUNTER — Other Ambulatory Visit: Payer: Self-pay | Admitting: Obstetrics and Gynecology

## 2023-06-21 DIAGNOSIS — R87610 Atypical squamous cells of undetermined significance on cytologic smear of cervix (ASC-US): Secondary | ICD-10-CM | POA: Insufficient documentation

## 2023-06-21 DIAGNOSIS — O09899 Supervision of other high risk pregnancies, unspecified trimester: Secondary | ICD-10-CM | POA: Insufficient documentation

## 2023-06-21 HISTORY — DX: Supervision of other high risk pregnancies, unspecified trimester: O09.899

## 2023-06-21 LAB — HORIZON CUSTOM: REPORT SUMMARY: POSITIVE — AB

## 2023-07-10 ENCOUNTER — Ambulatory Visit (INDEPENDENT_AMBULATORY_CARE_PROVIDER_SITE_OTHER): Payer: Medicaid Other | Admitting: Obstetrics and Gynecology

## 2023-07-10 ENCOUNTER — Encounter: Payer: Self-pay | Admitting: Obstetrics and Gynecology

## 2023-07-10 VITALS — BP 102/60 | HR 74 | Wt 172.2 lb

## 2023-07-10 DIAGNOSIS — Z3A17 17 weeks gestation of pregnancy: Secondary | ICD-10-CM

## 2023-07-10 DIAGNOSIS — Z3401 Encounter for supervision of normal first pregnancy, first trimester: Secondary | ICD-10-CM | POA: Diagnosis not present

## 2023-07-10 DIAGNOSIS — Z141 Cystic fibrosis carrier: Secondary | ICD-10-CM | POA: Diagnosis not present

## 2023-07-10 DIAGNOSIS — O09899 Supervision of other high risk pregnancies, unspecified trimester: Secondary | ICD-10-CM

## 2023-07-10 NOTE — Progress Notes (Signed)
Pt presents for ROB visit.  

## 2023-07-10 NOTE — Progress Notes (Signed)
   PRENATAL VISIT NOTE  Subjective:  Selena Dawson is a 23 y.o. G1P0 at [redacted]w[redacted]d being seen today for ongoing prenatal care.  She is currently monitored for the following issues for this low-risk pregnancy and has Encounter for supervision of normal first pregnancy in first trimester; Maternal obesity affecting pregnancy, antepartum; Cystic fibrosis carrier, antepartum; and ASCUS with positive high risk HPV cervical on their problem list.  Patient reports nausea and vomiting last night  Contractions: Not present. Vag. Bleeding: None.  Movement: Present. Denies leaking of fluid.   The following portions of the patient's history were reviewed and updated as appropriate: allergies, current medications, past family history, past medical history, past social history, past surgical history and problem list.   Objective:   Vitals:   07/10/23 0922  BP: 102/60  Pulse: 74  Weight: 172 lb 3.2 oz (78.1 kg)    Fetal Status: Fetal Heart Rate (bpm): 144   Movement: Present     General:  Alert, oriented and cooperative. Patient is in no acute distress.  Skin: Skin is warm and dry. No rash noted.   Cardiovascular: Normal heart rate noted  Respiratory: Normal respiratory effort, no problems with respiration noted  Abdomen: Soft, gravid, appropriate for gestational age.  Pain/Pressure: Absent     Pelvic: Cervical exam deferred        Extremities: Normal range of motion.  Edema: None  Mental Status: Normal mood and affect. Normal behavior. Normal judgment and thought content.   Assessment and Plan:  Pregnancy: G1P0 at [redacted]w[redacted]d 1. Encounter for supervision of normal first pregnancy in first trimester (Primary) BP and FHR normal   2. Cystic fibrosis carrier, antepartum Genetic counseling on 3/31  3. [redacted] weeks gestation of pregnancy Discussed AFP today Anatomy scan 3/31 Hydration, small frequent meals, nausea meds PRN Interested in waterbirth, discussed class, meeting with waterbirth provider Class  information provided at wrap up    Preterm labor symptoms and general obstetric precautions including but not limited to vaginal bleeding, contractions, leaking of fluid and fetal movement were reviewed in detail with the patient. Please refer to After Visit Summary for other counseling recommendations.   Return in about 4 weeks (around 08/07/2023).  Future Appointments  Date Time Provider Department Center  07/10/2023  9:35 AM Sue Lush, FNP [facility] None  07/31/2023  8:00 AM [facility] NURSE [facility]  07/31/2023  8:15 AM [facility] PROVIDER 1 [facility]  07/31/2023  8:30 AM [facility] US1 [facility]  07/31/2023 10:30 AM [facility] GENETIC COUNSELING RM [facility]    Albertine Grates, FNP

## 2023-07-10 NOTE — Patient Instructions (Addendum)
 [facility] Pediatric Providers  Central/[place]outheast [place] ([address]) [facility] Manson Passey, MD; Deirdre Priest, MD; Lum Babe, MD; Leveda Anna, MD; McDiarmid, MD; Jerene Bears, MD [address] [place] [place], [place] [address] [phone] Mon-Fri 8:30-12:30, 1:30-5:00  Providers come to see babies during newborn hospitalization Only accepting infants of Mother's who are seen at [facility] or have siblings seen at   [facility] Medicaid - Yes; Tricare - Yes   [facility] [place], MD [address] [place], [place] [address] [phone] Mon, Tue, Thur, Fri 8:30-5:00, Wed 10:00-7:00 (closed 1-2pm daily for lunch) [facility] residents with no insurance.  [place] [employer] only with Medicaid/insurance; Tricare - no  [facility]) - Tim and [facility], MD; Manson Passey, MD; Ave Filter, MD; Luna Fuse, MD; Kennedy Bucker, MD; Florestine Avers, MD; Melchor Amour, MD; Yetta Barre,  MD; Konrad Dolores, MD; Kathlene November, MD; Jenne Campus, MD; Wynetta Emery, MD; Duffy Rhody, MD; Gerre Couch, NP [address] [place], [place], [place] [address] [phone] Mon, Tue, Thur, Fri 8:30-5:30, Wed 9:30-5:30, Sat 8:30-12:30 Only accepting infants of first-time parents or siblings of current patients Hospital discharge coordinator will make follow-up appointment Medicaid - yes; Tricare - yes  [place] [phone]) [facility] Ilean China, MD; Earlene Plater, MD; Jamesetta Orleans, MD; Alvera Novel, MD; Rana Snare, MD; [facility], MD; Shaaron Adler, MD; Hosie Poisson, MD; Mayford Knife, MD [address] [place], [place] [address] [phone] Mon-Fri 8:30-5:00, closed for lunch 12:30-1:30; Sat-Sun 10:00-1:00 Accepting Newborns with commercial insurance only, must call prior to delivery to be accepted into  practice.  Medicaid - no, Tricare - yes   [facility] [address] [place], [place] [address] [phone] or [phone] Mon to Fri 8am to 10pm, Sat 8am to 1pm  (virtual only on weekends) Only accepts Medicaid Healthy Blue pts  [facility] ([facility]) - Pediatrics at Elige Radon, MD; Sabino Dick, MD; Quitman Livings, MD; Betha Loa, NP; Claretha Cooper, MD; Lelon Perla, MD [address] [place] [place], [place] [address] [phone] Mon-Fri 8:30-5:30 Medicaid - yes, Tricare - yes  [place] [phone]) [facility] Marcie Mowers, MD [address], [place], [place] [address] [phone] Iona Hansen, Wed Fri 8:30-5:00, Sat 8:30-12:00, Closed Thursdays Accepting siblings of established patients and first time mom's if you call prenatally Medicaid- yes; Tricare - yes  [facility] Lutricia Feil, [place]; Tracie Harrier, MD; Rusty Aus; Scifres, PA; Wynelle Link, MD; Azucena Cecil, MD;  [address] [place], [place] [address] [phone] Mon-Fri 8:30-5:00, closed for lunch 1-2 Only accepting newborns of established patients Medicaid- no; Tricare - yes  [facility] [phone]) [facility] Morene Crocker, MD; [address], [place], [place] [address] [phone] Mon-Fri 8:00-5:00 Medicaid - No; Tricare - Yes  [facility], [place]; [place], [place] [address] [place], [place] [address] [phone] Mon-Fri 8:00-5:00 Medicaid - No, Tricare - Yes  [facility] Cardell Peach, MD; Nash Dimmer, MD; [place], [place] [address] [place], [place] [address] [phone]  Mon-Fri 8:00-5:00 Medicaid - No; Tricare - Yes  [facility] [address], [place], [place] [address] [phone] Mon-Fri 8:30-5:00 (lunch 12:00-1:00) Medicaid -Yes; Tricare - Yes  [facility] [place], MD [address] [place], [place], [place] [address] [phone] Mon-Fri 8:00-5:00 Seeing newborns of current patients only. No new patients Medicaid - No, Tricare - yes  Nature conservation officer at [place] [address] MD [address] [place], [place] [address] [phone] Mon-Fri 8:00-5:00 Medicaid -yes as secondary coverage only;  Tricare - yes  [facility] [place], [place]; [place], [place]; Avis Epley, MD; Vonna Kotyk, MD; Clance Boll, MD; [place], [place]; Smoot, NP; Vaughan Basta, MD; Edmund, MD [address] [place], [place] [address] [phone] Mon-Fri 8:30-5:00, Sat 9:00-11:00 Accepts commercial insurance ONLY. Offers free prenatal information sessions for families. Medicaid - No, Tricare - Call first  [facility] [place], MD; [place], [place]; [place], [place]; [place], [place] [address] [place] [address] [phone] Mon-Fri 7:30-5:30 Medicaid - Yes; Ailene Rud yes  [place] [phone] & [phone])  [facility], MD [address], [place], [place] [address] [phone] Mon-Thur 8:00-6:00, closed for lunch 12-2, closed Fridays Medicaid - yes; Tricare - no  [facility] Dareen Piano, NP; Cyndia Bent, MD; [place], [place]; [place], [place] [address], [place], [place] [address] [phone] Mon-Fri 7:30-4:30 Medicaid - yes, Tricare - yes  [facility]  Juanito Doom, MD; Janene Harvey, NP; Vonita Moss, MD; Donn Pierini, NP [address], [place], [place] [address] [phone] Mon-Fri 8:30-5:00, closed for lunch 1-2, Sat 8:30-12:00 - sick visits only Providers come to see babies at [facility] Only accepting newborns of siblings and first time parents ONLY if who have met with office prior to delivery Medicaid -Yes; Tricare - yes  [facility] - [place], [place]; Spero Geralds, NP; Earlene Plater, MD; Lucretia Roers, MD:  [address], [place], [place] [address] [phone] Mon- Fri 8:00-5:00, Sat 9:00-12:00 - sick visits only Accepting siblings of established patients and first time mom/baby Medicaid - Yes; Tricare - yes Patients must have vaccinations (baby vaccines)  [place] [phone] &  [phone])  Adult nurse HealthCare at [facility] [address] [place], [place] [address] [phone] Mon-Fri 8:00-5:00 Medicaid - no; Tricare - yes  [facility] [place], MD; [place], [place]; [place], [place] [address], [place], [place] [address] [phone] Mon-Fri 8:00-5:00 Medicaid- yes; Tricare - yes  [facility] - Ardeen Jourdain, MD; Yetta Barre, NP; [place], [place] [address] [place], [place], [place] [address] [phone] Mon-Fri 8:00-5:00 Medicaid - Yes; Tricare - yes  [address] [place] [phone])  [facility] [place], [place]; [place], [place]; Eddie Candle, MD; Normand Sloop, MD; [place], NP; Isenhour, DO; [place], [place]; Constance Goltz, MD; Ruthann Cancer, MD; Vear Clock, MD; [place], [place]; [place], [place]; [place], [place] 6789 [facility] [address], [place], [place] [address] [phone] Mon-Fri 8:30-5:00, Sat 9:00-12:00 - sick only Please register online [email or web address] then schedule online or call office Medicaid-Yes; Tricare -yes  [facility] - Premier  [place], MD; Romualdo Bolk, MD; Barry, MD; [place], NP; [place], [place]; Antonietta Barcelona, MD; Mayford Knife, NP; Shelva Majestic, MD [address], [place], [place] [address] [phone] Mon-Fri 8:00-5:30, Sat&Sun by appointment (phones open at 8:30) Medicaid - Yes; Tricare - yes  [place] [phone] & [phone]) [facility] Mariel Aloe; Hasley [place], MD; Roger Shelter, MD; Arvilla Market, NP; Klug, DO [address], [place], [place] [address] [phone] M-F 8:00 - 5:15, Sat/Sun 9-12 sick visits only Medicaid - No; Tricare - yes  [facility]  [place], PA-C; [place], PA-C; [place], DO; [place], PA-C; [place], PA-C; Roselyn Bering, MD [address], [place], [place] [address] [phone] Mon-Thur 8:00-7:00, Fri 8:00-5:00 Accepting Medicaid for 13 and under only   [facility] Family Medicine  at [place] ([facility] [place]) [place], [place]; List, FNP; Berneda Rose, MD; [place], PA-C; Scholer,  MD; Kellie Simmering, FNP; Genevie Cheshire, FNP; Evaristo Bury, MD; Berneda Rose, MD [phone] [address], [place], [place] [address] [phone] Mon-Fri 8:30-5:30 Medicaid - Yes; Tricare - yes  [facility]s - [address]  [place], [place]; Whitney Post, MD; Hennie Duos, MD; Wynne Dust, MD; [place], NP [address], [place], [place] [address] [phone] Mon-Thur 8:00-5:30, Fri 8:00-5:00, Sat 9:00-12:00 Medicaid - yes, Tricare - yes  [place] [phone])  [facility], [place]; Lenise Arena, MD; [place], [place] [address], [place], [place] [address] [phone] Mon-Fri 8:00-5:00, closed for lunch 12-1 Medicaid - No; Tricare - yes  Nature conservation officer at [facility], MD [address] [place], [place] [address] [phone] Mon-Fri 8:00-5:00 Medicaid - No; Tricare - yes  [facility] - [facility], MD; Tami Ribas, MD; Mariam Dollar, MD; Yetta Barre, MD [address], [place], [place] [address] [phone] Mon-Fri 8:00-5:00 Medicaid- Yes; Tricare - yes  [place] [phone])  Adult nurse HealthCare at [facility], [place]; [place], MD [address] [place], [place], [place] [address] [phone] Mon-Fri 8:00-5:00 Medicaid - No; Tricare - yes  [facility] - Whitney Post - CPNP [address] [place], [place], [place] [address] [phone] Mon-Weds 8:00-6:00, Thurs-Fri 8:00-5:00, Sat 9:00-12:00 Medicaid - yes; Tricare - yes   [facility] Katharina Caper, MD; [place], [place] [address] [place], [place] [address] [phone] Mon-Fri 8:00-5:00 Medicaid - yes; Tricare - yes  [facility] Pediatric Providers  [facility] [address] [place], [place] [address] [phone] Sheral Flow: 8am -8pm, Tues, Weds: 8am - 5pm; Fri: 8-1 Medicaid - Yes; Tricare -  yes  [facility] Rachel Bo, MD; Laural Benes, MD; Anner Crete, MD; [place], [place]; [place], [place] [address], [place], [place] [address] [phone] M-F 8:30 - 5:00 Medicaid - Call office; Tricare -yes  [facility] Edson Snowball, MD; Shanon Rosser, MD, Chelsea Primus, MD; Shirlyn Goltz, PNP; Wardell Heath, NP [phone] [address] [place], [place] [address] [phone] M-F 8:30 - 5:00, Sat/Sun 8:30 - 12:30 (sick visits) Medicaid - Call office; Tricare -yes  [facility] Melvyn Neth, MD; Karl Luke, PNP; Princess Bruins, MD; [place], [place]; [place], NP; Cynda Familia [address], [place], [place] [address] [phone] M-F 8:30 - 5:00 Medicaid - Call office; Tricare - yes  [facility] Jesusita Oka, MD; Dierdre Highman, MD; Earnest Conroy, MD; Timothy Lasso, MD; [place]ogo, MD [phone] [address], [place], [place] [address] [phone] M-Thur: 8:00 - 5:00; Fri: 8:00 - 4:00 Medicaid - yes; Tricare - yes  [facility] [address] [place], [place] [address] [phone] M-F: 8:30- 5:00, closed for lunch 12:30 - 1:00 Medicaid - yes; Tricare -yes  [facility] [address] [place], [place] [address] [phone] M-F 8:00 - 5:00 Medicaid - yes; Tricare - yes  [facility] Sear[facility] Phillips, DO; [place], DO; [place], NP [address] [place], [place] [address] [phone] M-F 8:00 - 5:00, Closed 12-1 for lunch Medicaid - Call; Tricare - yes  [facility] Meredith Mody, MD [address], [place], [place] [address] [phone] M-F: 8:00-5:00, Sat: 8:00 - noon Medicaid - call; Tricare -yes  [facility] Pediatric Providers  [facility] [place], [place] [address] [place], [place], [place] [address] [phone] M-W: 8:00-5:00, Thur: 8:00 - 7:00, Fri: 8:00 - noon Medicaid - yes; Tricare - yes  [facility] - Quay Burow, FNP [address] [place] [address], [place],  [place] [address] [phone] M-F 8:00 - 5:00, Closed for lunch 12-1 Medicaid -  yes; Tricare - yes  [facility] Pediatric Providers  [facility] at [place], [place], Alinda Money, MD, [place], FNP-C [address], [facility], [address], [place], [place] [address] [phone] M-T 8:00-5:00, Wed-Fri 7:00-6:00 Medicaid - Yes; Tricare -yes  [facility] at [facility], [place]; [address], [address] Saner [place], [place] [address] [phone] M-F 8:00 - 5:00, closed for lunch 12-1 Medicaid - Yes; Tricare - yes  [facility] and Internal Medicine  Zachery Dauer, MD; Gladstone Lighter, MD; Collie Siad, MD; Freda Jackson, MD; Rich Number, MD; Darryl Nestle, MD; Melinda Crutch, MD, Audria Nine, MD; Tawanna Cooler, MD; Steffanie Dunn, MD; Byrd Hesselbach, MD; Lucretia Roers, MD [address] [place], [place] [address] [phone] M-F 8:00-5:00 Medicaid - yes; Tricare - yes  [facility] [place], MD (speaks [place] and Hindi) [address] [place], [place] [address] [phone] M-F: 8:30 - 5:00, closed 12:30 - 1 for lunch Medicaid - Yes; Tricare -yes  [facility] Pediatric Providers  [facility] Shanda Bumps, MD; Chanetta Marshall, MD; Laurell Josephs, MD [address], [place], [place] [address] [phone] M-Th: 8:00 - 5:30, Fri: 8:00 - 12:00 Medicaid - yes; Tricare - yes  [facility] - Pediatrics at [facility], NP; Thora Lance, MD; Orrin Brigham, MD 260 594 [address]80 [address], [place], [place] [address] [phone] M-F: 8:00 - 5:00 Medicaid - yes; Tricare - yes  [facility] [place], NP; Orson Slick, NP; Salley Scarlet, NP; Linton Flemings, MD; Mayford Knife, MD, Alpha, NP, Emelda Fear, MD; Nida Boatman [address], [place], [place] [address] [phone] M-F: 8:30 - 5:30p Medicaid - yes; Tricare - yes Other locations available as well  [facility], MD; Andrey Campanile, MD; Neville Route, PA-C [address], [place], [place] [address] [phone] M-W: 8:00am - 7:00pm, Thurs: 8:00am - 8:00pm; Fri: 8:00am -  5:00pm, closed daily from 12-1 for lunch Medicaid - yes; Tricare - yes  [facility] Pediatric Providers  [facility]s at Levin Erp, MD; Aggie Cosier, FNP; Bland Span, MD; Tristan Schroeder, MD; Cunningham, PNP; Alesia Banda; [place], [place]; Julian Reil, MD;  [address], [place], [place] [address] [phone] Judie Petit - Caleen Essex: 8am - 5pm, Sat 9-noon Medicaid - Yes; Tricare -yes  [facility] at Jaclynn Guarneri, MD; Yetta Barre, FNP; Lilian Kapur, MD; Mariam Dollar, MD [address] [place] Schlatter, PO24235 [phone] M-F 8:00 - 5:00 Medicaid - call; Tricare - yes  [facility]- Cruz Condon, MD; [place], [place]; Delora Fuel, MD; Dareen Piano, MD; Trudee Grip, MD; Kizzie Ide, MD; Zebedee Iba; Birdena Crandall, MD; Hinton Dyer, MD; [place], MD [address] [place], [place] [address] [phone] M-F 8:00am - 5:00pm; Sat. 9:00 - 11:00 Medicaid - yes; Tricare - yes  [facility] at [facility], MD [address], [place], [place] [address] [phone] M-F 8:00 - 5:00 Medicaid -  Medicaid only; Tricare - yes  [facility] - Illene Bolus, MD; Earlene Plater, [place]; Kenyon Ana, MD [address], [place], [place] [address] [phone] M-F 8:00 - 5:00 Medicaid - yes; Tricare - yes  [facility] [address] Pediatrics - Lind Covert, MD; Manson Passey, MD, [facility], MD, Jagual, MD; [place], MD; Katrinka Blazing, MD; [address] [place], [place] [address] [phone] M-F: 8-5 Medicaid - yes; Tricare - yes  [facility] - Henrietta Hoover, ; [place], MD; [address] [place], [place] [address] [phone] M-F 8-5 Medicaid - yes; Tricare - yes  [address] [place] Darrol Poke, MD; Tami Ribas, MD;  [place], MD; [place], DNP; [place], PNP [address], [place], [place] [address] [phone] M-F 8-5 Medicaid - yes; Tricare - yes  [facility] and Pediatrics Delories Heinz, MD;  Adrienne Mocha; Ala Bent, MD [address] [place], [place] [address] [phone] M-F 7am - 5 pm Medicaid - call; Tricare - yes  [facility] [place], [place]; Fredia Beets, MD; Roxan Hockey, MD [address] [place], [place] [address] [phone] M-F 8-5 Medicaid - yes; Tricare - yes  [facility] Kae Heller, MD; Sheliah Hatch, MD; Mayford Knife, FNP; Shon Baton, FNP; Tyron Russell, FNP; Ishmael Holter; [facility] - FNP [address] [place], [place] [address] [phone] M-F 8-5 Medicaid- yes; Tricare - yes  [facility]s - Betsy Coder, [place]ively and Chalmers Guest, MD; Terrial Rhodes, MD; Hulda Humphrey, MD; Roseanne Reno, MD; [place], [place]; Ala Dach, MD; Fredia Beets, MD; Dimple Casey, MD [address] [place], [place] [address] [phone] M-F: 8-5, Sat: 9-4, Sun 9-12 Medicaid - yes; Tricare - yes  [facility] Little, PNP; Earlene Plater, PNP [address] [place], [place] [address] [phone] M-F 8 - 5, closed 12-1 for lunch Medicaid - yes; Tricare - yes  [facility] Pediatrics Kathyrn Lass, MD; Hal Neer, MD; Dimple Casey, MD; Parker, DO [address], [place], [place] [address] [phone] M-F 8- 5:30 Medicaid - yes; Tricare - yes  [facility] Pediatrics - Biagio Quint, MD; Rosalia Hammers, MD; Gwenith Daily, MD [address], [place], [place] [address] [phone] Judie Petit: Nicholas Lose; Tues-Fri: 8-5; Sat: 9-12 Medicaid - yes; Tricare - yes  [facility] Pediatrics - Bobbye Morton, MD; Daphane Shepherd, MD; Chestine Spore, MD; Haskell Riling, MD; Kate Sable, MD [address] [place], [place] [address] [phone] Judie PetitMarland Kitchen Nicholas LoseFrancee Nodal: 8-5; Sat: 8:30-12:30 Medicaid - yes; Tricare - yes  [facility]lena Heckle [facility]s - Beckey Rutter, MD; [place], [place] [address], [place], [place] [address] [phone] Mon-Fri: 8-5 Medicaid - yes;  Tricare - yes  Darnelle Bos [facility] Pediatrics - [place], CPNP; [place], [place]; Dimple Casey, MD; Alisa Graff, MD; Cephus Shelling, MD; [address] [place], [place] [address] [phone] M-F: 8-5, closed 1-2 for lunch Medicaid - yes; Tricare - yes  Darnelle Bos [facility] Pediatrics - [facility] [place], [place]; [place], [place]; Katrinka Blazing, MD; [place], CPNP; [place], [place]; [place], MD; Earlene Plater, MD [address], [place], [place] [address] [phone] M-Thurs: Nicholas Lose; Fri: 8-6; Sat: 9-12; Sun 2-4 Medicaid - yes; Tricare - yes  [facility]elle Bos [facility] Georgeanna Lea, MD; Evette Cristal, MD; Shea Stakes, FNP; Earney Mallet, DO; [address] [place], [place] [address] [phone] M-F: 8-5 Medicaid - yes; Tricare - yes  [facility] Pediatric Providers  [facility] Family Medicine -Collene Mares, MD; [place], NP [address], [place], [place] [address] [phone] M - Fri: 8am - 5pm, closed for lunch 12-1 Medicaid - Yes; Tricare - yes  [facility] Elinor Parkinson, MD; Victory Dakin, MD; [place], DO; Vinocur, MD;Hall, PA; Clent [place], [place]; Orvan Falconer, NP [phone] [address] [place], [place] [address] [phone] M-F 8:00 - 5:00, Sat 8:00 - 11:30 Medicaid - yes; Tricare - yes  [facility] Welton Flakes, MD; Tina, MD, [address], MD, [place], MD, Walton, MD; [place], NP; [place], [place];  [address] [place], [place] [address] [phone] M-F 8:10am - 5:00pm Medicaid - yes; Tricare - yes  [facility] [place], MD; [place], NP [address], [place], [place] [address] [phone] M-F 8:00 - 5:00 Medicaid - [place] Medicaid only; Tricare - yes  [facility] - Deep [address] [place], [place]; [place], NP [address], [place], [place] [address] [phone] M-F 8:00 - 5:00; Closed for lunch 12 - 1:00 Medicaid - yes;  Tricare - yes  [facility] Belva Crome, MD; Jonita Albee, FNP [address] [place], [place] [address] [phone] Mon 9-5; Tues/Wed 10-5; Thurs 8:30-5; Fri: 8-12:30 Medicaid - yes; Tricare - yes  [facility] Pediatric Providers  [facility]  [place], MD; [place], [place] [address], [place], [place] [address] [phone] phone [phone] fax M-F 7:15 - 4:30 Medicaid - yes; Tricare - yes  [facility] Karilyn Cota, MD; [place], DO [address] [place], [place] [address] [phone] M-Fri: 8:30 - 5:00, closed for lunch everyday noon - 1pm Medicaid - Yes; Tricare - yes  [facility] Burdine, MD; Reuel Boom, MD; Dimas Aguas, MD; Neita Carp, MD; [place], [place]; Bonnita Nasuti, [place]; [place], [place]; [place], [place]; [place], [place] [address] [place], [place] [address] [phone] M-Thurs: 7:30am - 7:00pm; Friday 7:30am - 4pm; Sat: 8:00 - 1:00 Medicaid - Yes; Tricare - yes  [place] [facility] Norval Morton, MD; Conni Elliot, MD; Carroll Kinds, MD; [place], DO [address], [place], [place] [address] [phone] M-Thur: 8:00 - 5:00, Fri: 8:00 - Noon Medicaid - yes; Tricare - yes No [place] [employer]  [place] Dettinger, MD; Nadine Counts, DO; [place], NP; Daphine Deutscher, NP; Lequita Halt, NP; Ellamae Sia, NP; Reginia Forts, NP; Darlyn Read, MD; [place], [place] [address], [place], [place] [address] [phone] M-F 8:00 - 5:00 Medicaid - yes; Tricare - yes  [facility], FNP-C; [place], FNP-C [address] [place], [place] [address] [phone] M, W, R 8:00-5:00, Tues: 8:00am - 7:00pm; Fri 8:00 - noon Medicaid - Yes; Tricare - yes  [facility], MD [address] [place], [place] [address] [phone]  M-Thurs 8:30-5:30, Fri: 8:30-12:30pm Medicaid - Yes; Tricare - N We highly recommend childbirth education to help you plan for labor and begin  practicing coping skills (which will be needed with or without pain meds).  [place] Childbirth Education Options: Sign up by visiting [email or web address]  Childbirth ~ Self-Paced eClass (English and Spanish) This online class offers you the freedom to complete a childbirth education series in the comfort of your own home at your own pace.  Childbirth Class (In-Person 4-Week Series  or on Saturdays, Virtual 4-Week Series ~ [place]) This interactive in-person class series will help you and your partner prepare for your birth experience. Topics include: Labor & Birth, Comfort Measures, Breathing Techniques, Massage, Medical Interventions, Pain Management Options, Cesarean Birth, Postpartum Care, and Newborn Care  Comfort Techniques for Labor ~ In-Person Class [facility]) This interactive class is designed for parents-to-be who want to learn & practice hands-on skills to help relieve some of the discomfort of labor and encourage their babies to rotate toward the best position for birth. Moms and their partners will be able to try a variety of labor positions with birth balls and rebozos as well as practice breathing, relaxation, and visualization techniques.  Natural Childbirth Class (In-Person 5-Week Series, In-Person on Saturdays or Virtual 5-Week Series ~ [place]) This class series is designed for expectant parents who want to learn and practice  natural methods of coping with the process of labor and childbirth.  Cesarean Birth Self-Paced eClass (English and Spanish) This online course provides comprehensive information you can trust as you prepare for a possible cesarean birth. In this class, you'll learn how to make your birth and recovery comfortable and joyful through instructive video clips, animations, and activities.  Waterbirth ~ Airline pilot Interested in a waterbirth? In addition to a consultation with your credentialed waterbirth provider, this free, informational online  class will help you discover whether waterbirth is the right fit for you. Not all obstetrical practices offer waterbirth, so check with your healthcare provider.  Tour Probation officer) - [facility] [employer] our 4 minute video tour of [facility] located in [place].   [place] Parenting Education Options:  Pregnancy 101 (Virtual) Congratulations on your pregnancy! This class is geared toward moms in their first trimester, but everyone is welcome. We are excited to guide you through all aspects of supporting a healthy pregnancy. You will learn what to expect at routine prenatal care appointments, common postpartum adjustments, basic infant safety, and breastfeeding.  Successful Partnering & Parenting ~ In-Person Workshop [facility]) This workshop inspires and equips partners of all economic levels, ages, and cultures to confidently care for their infants, support the birthing persons, and navigate their own transformations into new partners and parents. Learning activities are geared towards supporting partner, but moms are welcome to attend.  'Baby & Me' Parenting Group (Virtual on Wednesdays at 11am) Enjoy this time discussing newborn & infant parenting topics and family adjustment issues with other new parents in a relaxed environment. Each week brings a new speaker or baby-centered activity. This group offers support and connection to parents as they journey through the adjustments and struggles of that sometimes overwhelming first year after the birth of a child.  Baby Safety, CPR, & Choking Class ~ Virtual This life-saving information is meant to encourage parents as they learn important safety and prevention tips as well as infant CPR and relief of choking.  Breastfeeding Class (In-Person in [place] or [employer]) Families learn what to expect in the first days and weeks of breastfeeding your newborn. IF YOU ARE AN EMPLOYEE  TAKING THIS CLASS FOR CREDIT, DO NOT register yourself. Please e-mail [email or web address].   Breastfeeding Self-Paced eClass (English & Spanish) Families learn what to expect in the first days and weeks of breastfeeding your newborn.  Caring for Baby ~ In-Person, Virtual or Self-Paced Class This in-person class is for both expectant and adoptive parents who want to learn and practice the most up-to-date newborn care for their babies. Focus is on birth through the first six weeks of life.  CPR & Choking Relief for Infants & Children ~ In-Person Class [facility]) This in-person course is designed for any parent, expectant parent, or adult who cares for infants or children. Participants learn and demonstrate cardiopulmonary resuscitation and choking relief procedures for both infants and children.  Grandparent Love ~ In-Person Class Grandparents will learn the most updated infant care and safety recommendations. They will discover ways to support their own children during the transition into the parenting role and receive tips on communicating with the new parents.  [place] Parenting Support Group Options:  Bereavement Grief Support Group (Pregnancy/Infant Loss) - Virtual This is an ongoing experience that meets once a month and is designed to help you honor the past, assist you in discovering tools to strengthen you today, and aid you in developing hope  for the future.  Breastfeeding & Pumping Support Group (In-Person on Thursdays at 12pm or Virtual on Tuesdays at 5pm) Join [place] in-person each Thursday starting June 1st, 2023 at 12pm! This support group is free for all families looking for breastfeeding and/or pumping support.   Community-Based Childbirth Education Options:  [facility] Classes:  Childbirth education classes can help you get ready for a positive parenting experience. You can also meet other expectant parents and get free stuff for your baby.  Each class runs for five weeks on the same night and costs $45 for the mother-to-be and her support person. Medicaid covers the cost if you are eligible. Call [phone] to register.  [facility] [employer] [facility] offers a variety of programs for the [employer] and is another great way to get connected. Please go to [email or web address] for more information.  Childbirth With A Twist! Be informed of your options, get educated on birth, understand what your body is doing, learn how to cope, and have a lot of fun and laughs all while doing it either from the comfort of your couch OR in our cozy office and classroom space near the [place] airport. If you are taking a virtual class, then class is taught LIVE, so you can ask questions and receive answers in real-time from an experienced doula and childbirth educator.  This virtual childbirth education class will meet for five instruction times online.  Although we are based in [place], [place], this virtual class is open to anyone in the world. Please visit: [email or web address] for more information.  Books We Love: The [place] Guide to Childbirth by Harland German and Otila Back The First-Time Parent's Childbirth Handbook by Dr. Amie Critchley, CNM The Birth Partner by Truddie Crumble  Considering Waterbirth? Guide for patients at Center for [employer] [facility]) Why consider waterbirth? Gentle birth for babies  Less pain medicine used in labor  May allow for passive descent/less pushing  May reduce perineal tears  More mobility and instinctive maternal position changes  Increased maternal relaxation   Is waterbirth safe? What are the risks of infection, drowning or other complications? Infection:  Very low risk (3.7 % for tub vs 4.8% for bed)  7 in 8000 waterbirths with documented infection  Poorly cleaned equipment most common cause  Slightly lower group B strep transmission rate  Drowning   Maternal:  Very low risk  Related to seizures or fainting  Newborn:  Very low risk. No evidence of increased risk of respiratory problems in multiple large studies  Physiological protection from breathing under water  Avoid underwater birth if there are any fetal complications  Once baby's head is out of the water, keep it out.  Birth complication  Some reports of cord trauma, but risk decreased by bringing baby to surface gradually  No evidence of increased risk of shoulder dystocia. Mothers can usually change positions faster in water than in a bed, possibly aiding the maneuvers to free the shoulder.   There are 2 things you MUST do to have a waterbirth with [facility]: Attend a waterbirth class at [employer] [facility] at [facility]   3rd Wednesday of every month from 7-9 pm (virtual during COVID) [employer] at [email or web address] or [email or web address] or by calling [phone] Bring [place] the certificate from the class to your prenatal appointment or send via MyChart Meet with a midwife at 36 weeks* to see if you can still plan a waterbirth and to sign the consent.   *We also  recommend that you schedule as many of your prenatal visits with a midwife as possible.    Helpful information: You may want to bring a bathing suit top to the hospital to wear during labor but this is optional.  All other supplies are provided by the hospital. Please arrive at the hospital with signs of active labor, and do not wait at home until late in labor. It takes 45 min- 1 hour for fetal monitoring, and check in to your room to take place, plus transport and filling of the waterbirth tub.    Things that would prevent you from having a waterbirth: Premature, <37wks  Previous cesarean birth  Presence of thick meconium-stained fluid  Multiple gestation (Twins, triplets, etc.)  Uncontrolled diabetes or gestational diabetes requiring medication  Hypertension diagnosed in pregnancy or  preexisting hypertension (gestational hypertension, preeclampsia, or chronic hypertension) Fetal growth restriction (your baby measures less than 10th percentile on ultrasound) Heavy vaginal bleeding  Non-reassuring fetal heart rate  Active infection (MRSA, etc.). Group B Strep is NOT a contraindication for waterbirth.  If your labor has to be induced and induction method requires continuous monitoring of the baby's heart rate  Other risks/issues identified by your obstetrical provider   Please remember that birth is unpredictable. Under certain unforeseeable circumstances your provider may advise against giving birth in the tub. These decisions will be made on a case-by-case basis and with the safety of you and your baby as our highest priority.    Updated 08/04/21

## 2023-07-12 LAB — AFP, SERUM, OPEN SPINA BIFIDA
AFP MoM: 0.98
AFP Value: 37.5 ng/mL
Gest. Age on Collection Date: 17 wk
Maternal Age At EDD: 22.3 a
OSBR Risk 1 IN: 10000
Test Results:: NEGATIVE
Weight: 172 [lb_av]

## 2023-07-31 ENCOUNTER — Ambulatory Visit (HOSPITAL_BASED_OUTPATIENT_CLINIC_OR_DEPARTMENT_OTHER): Payer: Medicaid Other

## 2023-07-31 ENCOUNTER — Ambulatory Visit: Payer: Medicaid Other | Attending: Maternal & Fetal Medicine | Admitting: *Deleted

## 2023-07-31 ENCOUNTER — Ambulatory Visit (HOSPITAL_BASED_OUTPATIENT_CLINIC_OR_DEPARTMENT_OTHER): Payer: Medicaid Other | Admitting: Maternal & Fetal Medicine

## 2023-07-31 ENCOUNTER — Ambulatory Visit: Payer: 59

## 2023-07-31 ENCOUNTER — Other Ambulatory Visit: Payer: Self-pay | Admitting: *Deleted

## 2023-07-31 VITALS — BP 109/62 | HR 83

## 2023-07-31 DIAGNOSIS — Z148 Genetic carrier of other disease: Secondary | ICD-10-CM

## 2023-07-31 DIAGNOSIS — O09899 Supervision of other high risk pregnancies, unspecified trimester: Secondary | ICD-10-CM

## 2023-07-31 DIAGNOSIS — O3412 Maternal care for benign tumor of corpus uteri, second trimester: Secondary | ICD-10-CM | POA: Diagnosis not present

## 2023-07-31 DIAGNOSIS — O99212 Obesity complicating pregnancy, second trimester: Secondary | ICD-10-CM

## 2023-07-31 DIAGNOSIS — Z141 Cystic fibrosis carrier: Secondary | ICD-10-CM

## 2023-07-31 DIAGNOSIS — O99210 Obesity complicating pregnancy, unspecified trimester: Secondary | ICD-10-CM

## 2023-07-31 DIAGNOSIS — O285 Abnormal chromosomal and genetic finding on antenatal screening of mother: Secondary | ICD-10-CM

## 2023-07-31 DIAGNOSIS — E669 Obesity, unspecified: Secondary | ICD-10-CM

## 2023-07-31 DIAGNOSIS — Z3401 Encounter for supervision of normal first pregnancy, first trimester: Secondary | ICD-10-CM

## 2023-07-31 DIAGNOSIS — Z3A20 20 weeks gestation of pregnancy: Secondary | ICD-10-CM | POA: Diagnosis not present

## 2023-07-31 DIAGNOSIS — Z363 Encounter for antenatal screening for malformations: Secondary | ICD-10-CM | POA: Insufficient documentation

## 2023-07-31 DIAGNOSIS — D259 Leiomyoma of uterus, unspecified: Secondary | ICD-10-CM | POA: Diagnosis not present

## 2023-07-31 DIAGNOSIS — O09892 Supervision of other high risk pregnancies, second trimester: Secondary | ICD-10-CM

## 2023-07-31 NOTE — Progress Notes (Cosign Needed Addendum)
 [facility] at [facility] [address] Phone:  [phone]   Fax:  [phone]      In-Person Genetic Counseling Clinic Note:   I spoke with 23 y.o. Selena Dawson today to discuss her carrier screening results. She was referred by Constant, Peggy, MD . She was accompanied by her mother.   Pregnancy History:    G1P0. EGA: [redacted]w[redacted]d by LMP. EDD: 12/18/2023. Denies personal health concerns. Denies bleeding, infections, and fevers in this pregnancy. Denies using tobacco, alcohol, or street drugs in this pregnancy.   Family History:    A three-generation pedigree was created and scanned into Epic under the Media tab.  Selena Dawson reports her 59 month old nephew was born with postaxial polydactyly in one hand and both feet. He is healthy. We discussed that isolated postaxial polydactyly can have autosomal dominant inheritance with incomplete penetrance. It is also more common in Black populations. Postaxial polydactyly can also be present within genetic syndromes. If there is a genetic condition, recurrence risk may be up to 50%. Recurrence risk is difficult to estimate in isolated cases and in the absence of genetic test or medical reports.   Selena Dawson reports her brother d. 59 yo from heart failure that developed in his early 52s. They report that he had an unhealthy lifestyle and was not followed by a medical team. We discussed that heart conditions and failure can be due to multifactorial or genetic causes. Without genetic testing or medical reports, recurrence risk is difficult to estimate.   We reviewed that the fetal ultrasounds will screen for polydactyly and structural heart defects. If Selena Dawson learns of additional information regarding her family members' diagnoses, we are happy to revisit recurrence risk.  Selena Dawson has limited family history for FOB.  Maternal ethnicity reported as Black and paternal ethnicity reported as Black. Denies Ashkenazi  Jewish ancestry.  Family history not remarkable for consanguinity, intellectual disability, autism spectrum disorder, multiple spontaneous abortions, still births, or unexplained neonatal death.   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 Selena Dawson was not found to be a carrier for alpha thalassemia, beta-hemoglobinopathies, or spinal muscular atrophy. Please see report for details. A negative result on carrier screening reduces but does not eliminate the chance of being a carrier.  We reviewed genes, autosomal recessive inheritance of CF, and the clinical features of CF. We reviewed there would be a 25% chance the pregnancy would be affected if FOB is also found to be a carrier. If both members of a couple are known to be carriers, diagnostic testing through amniocentesis is available to determine if the pregnancy is affected. The benefits, risks, and limitations of amniocentesis including the 1 in 500 risk for amniocentesis were reviewed. We also discussed that CF is included on [place] Newborn Screening Program. The newborn screening test utilizes trypsinogen levels rather than genetic testing to detect affected individuals whereas genetic testing can determine the specific variants, if any, that a child may have inherited. Therefore, Selena Dawson may also seek postnatal genetic counseling and testing if needed or desired.   Given these results, we discussed and offered carrier screening for Selena Dawson's reproductive partner. We reviewed the benefits and limitations of carrier screening and that it can detect most but not all carriers.   She declined at this time, as  he is not available for testing. We discussed other options, including prenatal diagnosis through amniocentesis, UNITY single gene NIPS, and postnatal testing. The benefits,  risks, and limitations of each were reviewed. Selena Dawson declined UNITY single gene NIPS and amniocentesis. She elected to wait for the NBS results and for a referral to pediatric genetics if needed.  In the meantime, we calculated the risk of CF to the pregnancy based on our current knowledge of Selena Dawson's test results and FOB's ethnicity. The chance that the fetus would be affected with CF would be 1 in 244 (0.4%).  Previous Testing Completed:  Low risk NIPS: Selena Dawson previously completed noninvasive prenatal screening (NIPS) in this pregnancy. The result is low risk, consistent with a female fetus. This screening significantly reduces but does not eliminate the chance that the current pregnancy has Down syndrome (trisomy 3), trisomy 45, trisomy 23, common sex chromosome conditions, and 22q11.2 microdeletion syndrome. Please see report for details. There are many genetic conditions that cannot be detected by NIPS.   Negative ms-AFP screening: Selena Dawson previously completed a maternal serum AFP screen in this pregnancy. The result is screen negative. Please see report for details. A negative result reduces the risk that the current pregnancy has an open neural tube defect. Closed neural tube defects and some open defects may not be detected by this screen.   Plan of Care:   Selena Dawson declined FOB carrier screening for CF, UNITY single gene NIPS, and amniocentesis. NBS for CF.   Informed consent was obtained. All questions were answered.   35 minutes were spent on the date of the encounter in service to the patient including preparation, face-to-face consultation, discussion of test reports and available next steps, pedigree construction, genetic risk assessment, documentation, and care coordination.    Thank you for sharing in the care of Selena Dawson with [place].  Please do not hesitate to contact us at [phone] if you have any questions.   Sheppard Plumber, MS, [facility] Certified Genetic Counselor   Genetic counseling student  involved in appointment: No.   I provided general supervision for this patient and was immediately available for any patient care concerns. [employer]

## 2023-07-31 NOTE — Progress Notes (Signed)
   Patient information  Patient Name: Selena Dawson [facility]  Patient MRN:   [id number]  Referring practice: MFM Referring Provider: [place] [facility]  MFM CONSULT  [name] is a 23 y.o. G1P0 at [redacted]w[redacted]d here for ultrasound and consultation. Patient Active Problem List   Diagnosis Date Noted   Uterine fibroids affecting pregnancy in second trimester 07/31/2023   Cystic fibrosis carrier, antepartum 06/21/2023   ASCUS with positive high risk HPV cervical 06/21/2023   Encounter for supervision of normal first pregnancy in first trimester 06/12/2023   Maternal obesity affecting pregnancy, antepartum 06/12/2023    [facility] has a pregnancy with the complications mentioned in the problem list. During today's visit we focused on the following concerns:    I discussed the presence of a medium sized fibroid that the patient was previously unaware of.  We discussed the potential medical complications and risks associated with fibroids in pregnancy including but not limited to affecting the fetal growth, degenerative pain and also growth of the fibroid.  Given the location I do not suspect cause any major problems during her pregnancy but growth ultrasounds are advised.  Sonographic findings Single intrauterine pregnancy at 20w 0d  Fetal cardiac activity:  Observed and appears normal. Presentation: Cephalic. The anatomic structures that were well seen appear normal without evidence of soft markers. Due to poor acoustic windows some structures remain suboptimally visualized. Fetal biometry shows the estimated fetal weight at the 57 percentile.  Amniotic fluid: Within normal limits.  MVP: 4.67 cm. Placenta: Posterior. Adnexa: No abnormality visualized. Cervical length: 3.1 cm.  There are limitations of prenatal ultrasound such as the inability to detect certain abnormalities due to poor visualization. Various factors such as fetal position, gestational age and maternal body habitus may increase the  difficulty in visualizing the fetal anatomy.    Recommendations -EDD should be 12/18/2023 based on  LMP  (03/13/23). -Follow up ultrasound in 4-6 weeks to attempt visualization of the anatomy not seen and reassess the fetal growth -Continue routine prenatal care with referring OB provider -Genetic counseling to follow today's visit for CF carrier  Review of Systems: A review of systems was performed and was negative except per HPI   Vitals and Physical Exam    07/31/2023    8:01 AM 07/10/2023    9:22 AM 06/12/2023    1:15 PM  Vitals with BMI  Weight  172 lbs 3 oz 169 lbs 14 oz  BMI   30.1  Systolic 109 102 409  Diastolic 62 60 73  Pulse 83 74 94    Sitting comfortably on the sonogram table Nonlabored breathing Normal rate and rhythm Abdomen is nontender  Past pregnancies OB History  Gravida Para Term Preterm AB Living  1       SAB IAB Ectopic Multiple Live Births          # Outcome Date GA Lbr Len/2nd Weight Sex Type Anes PTL Lv  1 Current             I spent 30 minutes reviewing the patients chart, including labs and images as well as counseling the patient about her medical conditions. Greater than 50% of the time was spent in direct face-to-face patient counseling.  Braxton Feathers, DO Maternal fetal medicine, [place]   07/31/2023  11:49 AM

## 2023-08-04 ENCOUNTER — Ambulatory Visit: Payer: Self-pay

## 2023-08-08 ENCOUNTER — Encounter: Admitting: Advanced Practice Midwife

## 2023-08-08 NOTE — Progress Notes (Deleted)
   PRENATAL VISIT NOTE  Subjective:  Selena Dawson is a 23 y.o. G1P0 at [redacted]w[redacted]d being seen today for ongoing prenatal care.  She is currently monitored for the following issues for this {Blank single:19197::"high-risk","low-risk"} pregnancy and has Encounter for supervision of normal first pregnancy in first trimester; Cystic fibrosis carrier, antepartum; ASCUS with positive high risk HPV cervical; and Uterine fibroids affecting pregnancy in second trimester on their problem list.  Patient reports {sx:14538}.   .  .   . Denies leaking of fluid.   The following portions of the patient's history were reviewed and updated as appropriate: allergies, current medications, past family history, past medical history, past social history, past surgical history and problem list.   Objective:  There were no vitals filed for this visit.  Fetal Status:           General:  Alert, oriented and cooperative. Patient is in no acute distress.  Skin: Skin is warm and dry. No rash noted.   Cardiovascular: Normal heart rate noted  Respiratory: Normal respiratory effort, no problems with respiration noted  Abdomen: Soft, gravid, appropriate for gestational age.        Pelvic: {Blank single:19197::"Cervical exam performed in the presence of a chaperone","Cervical exam deferred"}        Extremities: Normal range of motion.     Mental Status: Normal mood and affect. Normal behavior. Normal judgment and thought content.   Assessment and Plan:  Pregnancy: G1P0 at [redacted]w[redacted]d 1. Encounter for supervision of normal first pregnancy in first trimester (Primary) ***  2. [redacted] weeks gestation of pregnancy ***  {Blank single:19197::"Term","Preterm"} labor symptoms and general obstetric precautions including but not limited to vaginal bleeding, contractions, leaking of fluid and fetal movement were reviewed in detail with the patient. Please refer to After Visit Summary for other counseling recommendations.   No follow-ups on  file.  Future Appointments  Date Time Provider Department Center  08/08/2023  9:35 AM Hurshel Party, CNM [facility] None  08/28/2023  1:00 PM [facility] PROVIDER 1 [facility]  08/28/2023  1:30 PM [facility] US4 [facility]    Sharen Counter, CNM

## 2023-08-28 ENCOUNTER — Ambulatory Visit (HOSPITAL_BASED_OUTPATIENT_CLINIC_OR_DEPARTMENT_OTHER)

## 2023-08-28 ENCOUNTER — Ambulatory Visit: Attending: Maternal & Fetal Medicine | Admitting: Obstetrics

## 2023-08-28 VITALS — BP 110/55

## 2023-08-28 DIAGNOSIS — Z141 Cystic fibrosis carrier: Secondary | ICD-10-CM | POA: Insufficient documentation

## 2023-08-28 DIAGNOSIS — O09899 Supervision of other high risk pregnancies, unspecified trimester: Secondary | ICD-10-CM

## 2023-08-28 DIAGNOSIS — Z363 Encounter for antenatal screening for malformations: Secondary | ICD-10-CM | POA: Diagnosis not present

## 2023-08-28 DIAGNOSIS — Z3A24 24 weeks gestation of pregnancy: Secondary | ICD-10-CM

## 2023-08-28 DIAGNOSIS — O09892 Supervision of other high risk pregnancies, second trimester: Secondary | ICD-10-CM | POA: Insufficient documentation

## 2023-08-28 DIAGNOSIS — O99212 Obesity complicating pregnancy, second trimester: Secondary | ICD-10-CM | POA: Insufficient documentation

## 2023-08-28 DIAGNOSIS — O358XX0 Maternal care for other (suspected) fetal abnormality and damage, not applicable or unspecified: Secondary | ICD-10-CM

## 2023-08-28 DIAGNOSIS — E669 Obesity, unspecified: Secondary | ICD-10-CM | POA: Diagnosis not present

## 2023-08-28 NOTE — Progress Notes (Signed)
 MFM Consult Note  Selena Dawson is currently at 24 weeks and 0 days.  She was seen as the views of the fetal anatomy were unable to be fully visualized during her last exam.    She denies any problems since her last exam.  The overall EFW obtained today of 1 pound 5 ounces measures at the 17th percentile for her gestational age.  There was normal amniotic fluid noted.  The views of the fetal anatomy were visualized today.  There were no obvious anomalies noted.    The limitations of ultrasound in the detection of all anomalies was discussed.    As the views of the fetal anatomy were visualized today, no further exams were scheduled in our office.    The patient stated that all of her questions were answered today.  A total of 20 minutes was spent counseling and coordinating the care for this patient.  Greater than 50% of the time was spent in direct face-to-face contact.

## 2023-08-29 ENCOUNTER — Encounter: Payer: Self-pay | Admitting: Advanced Practice Midwife

## 2023-08-29 ENCOUNTER — Ambulatory Visit (INDEPENDENT_AMBULATORY_CARE_PROVIDER_SITE_OTHER): Admitting: Advanced Practice Midwife

## 2023-08-29 VITALS — BP 106/60 | HR 96 | Wt 176.8 lb

## 2023-08-29 DIAGNOSIS — Z3A24 24 weeks gestation of pregnancy: Secondary | ICD-10-CM | POA: Diagnosis not present

## 2023-08-29 DIAGNOSIS — Z141 Cystic fibrosis carrier: Secondary | ICD-10-CM | POA: Diagnosis not present

## 2023-08-29 DIAGNOSIS — Z3401 Encounter for supervision of normal first pregnancy, first trimester: Secondary | ICD-10-CM | POA: Diagnosis not present

## 2023-08-29 DIAGNOSIS — O09899 Supervision of other high risk pregnancies, unspecified trimester: Secondary | ICD-10-CM | POA: Diagnosis not present

## 2023-08-29 NOTE — Progress Notes (Signed)
 Pt present for ROB visit. Pt c/o nausea.

## 2023-08-29 NOTE — Progress Notes (Signed)
   PRENATAL VISIT NOTE  Subjective:  Selena Dawson is a 23 y.o. G1P0 at [redacted]w[redacted]d being seen today for ongoing prenatal care.  She is currently monitored for the following issues for this low-risk pregnancy and has Encounter for supervision of normal first pregnancy in first trimester; Cystic fibrosis carrier, antepartum; ASCUS with positive high risk HPV cervical; and Uterine fibroids affecting pregnancy in second trimester on their problem list.  Patient reports no complaints.  Contractions: Not present. Vag. Bleeding: None.  Movement: Present. Denies leaking of fluid.   The following portions of the patient's history were reviewed and updated as appropriate: allergies, current medications, past family history, past medical history, past social history, past surgical history and problem list.   Objective:   Vitals:   08/29/23 1547 08/29/23 1550  BP:  106/60  Pulse:  96  Weight: 176 lb 12.8 oz (80.2 kg) 176 lb 12.8 oz (80.2 kg)    Fetal Status: Fetal Heart Rate (bpm): 155 Fundal Height: 24 cm Movement: Present     General:  Alert, oriented and cooperative. Patient is in no acute distress.  Skin: Skin is warm and dry. No rash noted.   Cardiovascular: Normal heart rate noted  Respiratory: Normal respiratory effort, no problems with respiration noted  Abdomen: Soft, gravid, appropriate for gestational age.  Pain/Pressure: Absent     Pelvic: Cervical exam deferred        Extremities: Normal range of motion.  Edema: Trace  Mental Status: Normal mood and affect. Normal behavior. Normal judgment and thought content.   Assessment and Plan:  Pregnancy: G1P0 at [redacted]w[redacted]d 1. Encounter for supervision of normal first pregnancy in first trimester (Primary) --Anticipatory guidance about next visits/weeks of pregnancy given.   2. Cystic fibrosis carrier, antepartum --Pt considering testing/genetics counseling  3. [redacted] weeks gestation of pregnancy   Preterm labor symptoms and general obstetric  precautions including but not limited to vaginal bleeding, contractions, leaking of fluid and fetal movement were reviewed in detail with the patient. Please refer to After Visit Summary for other counseling recommendations.   Return in about 4 weeks (around 09/26/2023) for Midwife preferred, GTT at next visit.  Future Appointments  Date Time Provider Department Center  09/27/2023  9:05 AM [facility] None  09/27/2023  9:55 AM Leftwich-Kirby, Darren Em, CNM [facility] None    Arlester Bence, CNM

## 2023-09-14 ENCOUNTER — Other Ambulatory Visit: Payer: Self-pay

## 2023-09-14 ENCOUNTER — Encounter (HOSPITAL_COMMUNITY): Payer: Self-pay | Admitting: *Deleted

## 2023-09-14 ENCOUNTER — Inpatient Hospital Stay (HOSPITAL_COMMUNITY)
Admission: AD | Admit: 2023-09-14 | Discharge: 2023-09-14 | Disposition: A | Discharge status: Home / Self Care | Attending: Obstetrics & Gynecology | Admitting: Obstetrics & Gynecology

## 2023-09-14 ENCOUNTER — Ambulatory Visit (HOSPITAL_COMMUNITY): Admission: EM | Admit: 2023-09-14 | Discharge: 2023-09-14 | Disposition: A | Discharge status: Home / Self Care

## 2023-09-14 ENCOUNTER — Encounter (HOSPITAL_COMMUNITY): Payer: Self-pay | Admitting: Obstetrics & Gynecology

## 2023-09-14 DIAGNOSIS — O99282 Endocrine, nutritional and metabolic diseases complicating pregnancy, second trimester: Secondary | ICD-10-CM | POA: Diagnosis not present

## 2023-09-14 DIAGNOSIS — O219 Vomiting of pregnancy, unspecified: Secondary | ICD-10-CM | POA: Diagnosis not present

## 2023-09-14 DIAGNOSIS — D509 Iron deficiency anemia, unspecified: Secondary | ICD-10-CM | POA: Diagnosis not present

## 2023-09-14 DIAGNOSIS — O212 Late vomiting of pregnancy: Secondary | ICD-10-CM | POA: Insufficient documentation

## 2023-09-14 DIAGNOSIS — O99012 Anemia complicating pregnancy, second trimester: Secondary | ICD-10-CM | POA: Insufficient documentation

## 2023-09-14 DIAGNOSIS — O26892 Other specified pregnancy related conditions, second trimester: Secondary | ICD-10-CM | POA: Diagnosis not present

## 2023-09-14 DIAGNOSIS — O99280 Endocrine, nutritional and metabolic diseases complicating pregnancy, unspecified trimester: Secondary | ICD-10-CM

## 2023-09-14 DIAGNOSIS — Z3A00 Weeks of gestation of pregnancy not specified: Secondary | ICD-10-CM

## 2023-09-14 DIAGNOSIS — Z3A26 26 weeks gestation of pregnancy: Secondary | ICD-10-CM

## 2023-09-14 DIAGNOSIS — O26893 Other specified pregnancy related conditions, third trimester: Secondary | ICD-10-CM

## 2023-09-14 DIAGNOSIS — E860 Dehydration: Secondary | ICD-10-CM | POA: Diagnosis not present

## 2023-09-14 DIAGNOSIS — R12 Heartburn: Secondary | ICD-10-CM

## 2023-09-14 LAB — COMPREHENSIVE METABOLIC PANEL WITH GFR
ALT: 11 U/L (ref 0–44)
AST: 13 U/L — ABNORMAL LOW (ref 15–41)
Albumin: 3.1 g/dL — ABNORMAL LOW (ref 3.5–5.0)
Alkaline Phosphatase: 52 U/L (ref 38–126)
Anion gap: 8 (ref 5–15)
BUN: <5 mg/dL — ABNORMAL LOW (ref 6–20)
CO2: 21 mmol/L — ABNORMAL LOW (ref 22–32)
Calcium: 8.6 mg/dL — ABNORMAL LOW (ref 8.9–10.3)
Chloride: 105 mmol/L (ref 98–111)
Creatinine, Ser: 0.55 mg/dL (ref 0.44–1.00)
GFR, Estimated: >60 mL/min (ref >60)
Glucose, Bld: 72 mg/dL (ref 70–99)
Potassium: 3.7 mmol/L (ref 3.5–5.1)
Sodium: 134 mmol/L — ABNORMAL LOW (ref 135–145)
Total Bilirubin: 0.6 mg/dL (ref 0.0–1.2)
Total Protein: 6.6 g/dL (ref 6.5–8.1)

## 2023-09-14 LAB — URINALYSIS, ROUTINE W REFLEX MICROSCOPIC
Bilirubin Urine: NEGATIVE
Glucose, UA: NEGATIVE mg/dL
Hgb urine dipstick: NEGATIVE
Ketones, ur: 80 mg/dL — AB
Leukocytes,Ua: NEGATIVE
Nitrite: NEGATIVE
Protein, ur: NEGATIVE mg/dL
Specific Gravity, Urine: 1.019 (ref 1.005–1.030)
pH: 7 (ref 5.0–8.0)

## 2023-09-14 LAB — CBC
HCT: 28.1 % — ABNORMAL LOW (ref 36.0–46.0)
Hemoglobin: 9.3 g/dL — ABNORMAL LOW (ref 12.0–15.0)
MCH: 29.2 pg (ref 26.0–34.0)
MCHC: 33.1 g/dL (ref 30.0–36.0)
MCV: 88.1 fL (ref 80.0–100.0)
Platelets: 224 K/uL (ref 150–400)
RBC: 3.19 MIL/uL — ABNORMAL LOW (ref 3.87–5.11)
RDW: 12.4 % (ref 11.5–15.5)
WBC: 8.1 K/uL (ref 4.0–10.5)
nRBC: 0 % (ref 0.0–0.2)

## 2023-09-14 LAB — MAGNESIUM: Magnesium: 1.7 mg/dL (ref 1.7–2.4)

## 2023-09-14 MED ORDER — DIPHENHYDRAMINE HCL 50 MG/ML IJ SOLN
INTRAMUSCULAR | Status: AC
  Administered 2023-09-14: 25 mg via INTRAVENOUS
  Filled 2023-09-14: qty 1

## 2023-09-14 MED ORDER — LACTATED RINGERS IV BOLUS
1000.0000 mL | Freq: Once | INTRAVENOUS | Status: AC
  Administered 2023-09-14: 1000 mL via INTRAVENOUS

## 2023-09-14 MED ORDER — FAMOTIDINE 20 MG PO TABS: 20.0000 mg | ORAL_TABLET | Freq: Two times a day (BID) | ORAL | 0 refills | Status: DC

## 2023-09-14 MED ORDER — FERROUS SULFATE 325 (65 FE) MG PO TABS: 325.0000 mg | ORAL_TABLET | Freq: Every day | ORAL | 0 refills | Status: DC

## 2023-09-14 MED ORDER — FAMOTIDINE IN NACL 20-0.9 MG/50ML-% IV SOLN
20.0000 mg | Freq: Once | INTRAVENOUS | Status: AC
  Administered 2023-09-14: 20 mg via INTRAVENOUS
  Filled 2023-09-14: qty 50

## 2023-09-14 MED ORDER — METOCLOPRAMIDE HCL 5 MG/ML IJ SOLN
10.0000 mg | Freq: Once | INTRAMUSCULAR | Status: AC
  Administered 2023-09-14: 10 mg via INTRAVENOUS
  Filled 2023-09-14: qty 2

## 2023-09-14 MED ORDER — DIPHENHYDRAMINE HCL 50 MG/ML IJ SOLN: 25.0000 mg | Freq: Once | INTRAMUSCULAR | Status: AC

## 2023-09-14 NOTE — Discharge Instructions (Addendum)
 Please go to a maternal admission unit for further evaluation and management of your symptoms.

## 2023-09-14 NOTE — MAU Note (Signed)
..  Selena Dawson is a 23 y.o. at [redacted]w[redacted]d here in MAU reporting: nausea and vomiting that has been ongoing throughout pregnancy but has gotten worse in the last three days. Hasn't been able to keep anything down. Has been trying zofran  and phenergan  at home but they aren't staying down. Last took both this morning around 0800am.   Emesis in 24h: 5-6 No diarrhea, some mild constipation  Endorses +FM, no ctx or cramps, no leaking fluid.   Has had a headache today.    Pain score: denies pain  Vitals:   09/14/23 1601  BP: 115/63  Pulse: 88  Resp: 20  Temp: 98 F (36.7 C)  SpO2: 100%     FHT:145  Lab orders placed from triage:  UA

## 2023-09-14 NOTE — ED Notes (Signed)
 Patient is being discharged from the Urgent Care and sent to the Emergency Department via POV . Per Thena Fireman, NP, patient is in need of higher level of care due to limited resources, pregnant patient. Patient is aware and verbalizes understanding of plan of care.  Vitals:   09/14/23 1330  BP: 114/71  Pulse: 86  Resp: 20  Temp: 98.2 F (36.8 C)  SpO2: 95%

## 2023-09-14 NOTE — ED Provider Notes (Signed)
 [facility]    CSN: [id number] Arrival date & time: 09/14/23  1210      History   Chief Complaint Chief Complaint  Patient presents with   Nausea   Emesis    HPI Selena Dawson is a 23 y.o. female.   Patient presents today with 3 days of vomiting.  Reports she is pregnant and for the past 3 days, has not been able to keep food or fluids down.  She has tried medications as prescribed her OB/GYN and she has thrown those up as well.  She feels weak and tired.  No chest pain, shortness of breath, dizziness, or lightheadedness.    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 Authorizing Provider  ondansetron  (ZOFRAN -ODT) 4 MG disintegrating tablet Take 1 tablet (4 mg total) by mouth every 8 (eight) hours as needed for nausea or vomiting. Patient not taking: Reported on 08/29/2023 05/30/23   Dawson, Rolitta, CNM  pantoprazole  (PROTONIX ) 40 MG tablet Take 1 tablet (40 mg total) by mouth daily. 06/12/23   Constant, Peggy, MD  Prenatal Vit-Fe Fumarate-FA (PRENATAL MULTIVITAMIN) TABS tablet Take 1 tablet by mouth daily at 12 noon.    [provider]  promethazine  (PHENERGAN ) 12.5 MG tablet Take 1 tablet (12.5 mg total) by mouth every 6 (six) hours as needed for nausea or  vomiting. 05/15/23   Ebony Goldstein, MD    Family History Family History  Problem Relation Age of Onset   Diabetes Mother     Social History Social History   Tobacco Use   Smoking status: Never   Smokeless tobacco: Never  Vaping Use   Vaping status: Every Day   Substances: Nicotine, Flavoring  Substance Use Topics   Alcohol use: Yes    Comment: occasionally   Drug use: Yes    Types: Marijuana     Allergies   Patient has no known allergies.   Review of Systems Review of Systems Per HPI  Physical Exam Triage Vital Signs ED Triage Vitals  Encounter Vitals Group     BP 09/14/23 1330 114/71     Systolic BP Percentile --      Diastolic BP Percentile --      Pulse Rate 09/14/23 1330 86     Resp 09/14/23 1330 20     Temp 09/14/23 1330 98.2 F (36.8 C)     Temp src --      SpO2 09/14/23 1330 95 %  Weight --      Height --      Head Circumference --      Peak Flow --      Pain Score 09/14/23 1329 0     Pain Loc --      Pain Education --      Exclude from Growth Chart --    No data found.  Updated Vital Signs BP 114/71   Pulse 86   Temp 98.2 F (36.8 C)   Resp 20   LMP 03/13/2023 (Exact Date)   SpO2 95%   Visual Acuity Right Eye Distance:   Left Eye Distance:   Bilateral Distance:    Right Eye Near:   Left Eye Near:    Bilateral Near:     Physical Exam Vitals and nursing note reviewed.  Constitutional:      General: She is not in acute distress.    Appearance: Normal appearance. She is not toxic-appearing.  HENT:     Head: Normocephalic and atraumatic.     Right Ear: External ear normal.     Left Ear: External ear normal.     Mouth/Throat:     Mouth: Mucous membranes are moist.     Pharynx: Oropharynx is clear.  Eyes:     Extraocular Movements: Extraocular movements intact.  Cardiovascular:     Rate and Rhythm: Normal rate.  Pulmonary:     Effort: Pulmonary effort is normal. No respiratory distress.  Musculoskeletal:     Cervical  back: Normal range of motion.  Lymphadenopathy:     Cervical: No cervical adenopathy.  Skin:    General: Skin is warm and dry.     Capillary Refill: Capillary refill takes less than 2 seconds.     Coloration: Skin is not jaundiced or pale.     Findings: No bruising or erythema.  Neurological:     Mental Status: She is alert and oriented to person, place, and time.  Psychiatric:        Behavior: Behavior is cooperative.      UC Treatments / Results  Labs (all labs ordered are listed, but only abnormal results are displayed) Labs Reviewed - No data to display  EKG   Radiology No results found.  Procedures Procedures (including critical care time)  Medications Ordered in UC Medications - No data to display  Initial Impression / Assessment and Plan / UC Course  I have reviewed the triage vital signs and the nursing notes.  Pertinent labs & imaging results that were available during my care of the patient were reviewed by me and considered in my medical decision making (see chart for details).   Patient is well-appearing, normotensive, afebrile, not tachycardic, not tachypneic, oxygenating well on room air.    1. Nausea and vomiting in pregnancy Vitals are stable in triage and patient is well-appearing Since Zofran  and Phenergan  have not helped and she is not able to keep fluids down, I recommended further evaluation management and maternal admission unit Patient is in agreement to plan and is safe to transport via private vehicle  The patient was given the opportunity to ask questions.  All questions answered to their satisfaction.  The patient is in agreement to this plan.   Final Clinical Impressions(s) / UC Diagnoses   Final diagnoses:  Nausea and vomiting in pregnancy     Discharge Instructions      Please go to a maternal admission unit for further evaluation and management of your symptoms.  ED Prescriptions   None    PDMP not reviewed this  encounter.   Wilhemena Harbour, NP 09/14/23 1425

## 2023-09-14 NOTE — ED Triage Notes (Signed)
 PT reports she has had nausea and vomiting for 3 days. PT DID NOT OB the patient has had ongoing vomiting twith this pregnancy. PT reports the med given by OB has not worked.

## 2023-09-14 NOTE — MAU Provider Note (Signed)
 Chief Complaint:  Nausea   HPI    Selena Dawson is a 23 y.o. G1P0 at [redacted]w[redacted]d who presents to maternity admissions reporting that she has been having N/V for the last 3 days uncontrolled by current antiemetics. She is currently taking Zofran  and phenergan  with no relief. She states that she has been unable to tolerate PO Food or fluids x 3 days. She reports she has had difficulty with N/V throughout the pregnancy but it has gotten much worse over the last 3 days and now she describes her emesis as yellowish. She denies any sick contacts and she denies eating anything unusual recently. Denies any abdominal pain, fever, or chills,  Offers no OB c/o. No VB, LOF, or CTX's and reports good FM's  Pregnancy Course: [facility]  Past Medical History:  Diagnosis Date   Medical history non-contributory    OB History  Gravida Para Term Preterm AB Living  1       SAB IAB Ectopic Multiple Live Births          # Outcome Date GA Lbr Len/2nd Weight Sex Type Anes PTL Lv  1 Current            Past Surgical History:  Procedure Laterality Date   NO PAST SURGERIES     Family History  Problem Relation Age of Onset   Diabetes Mother    Social History   Tobacco Use   Smoking status: Never   Smokeless tobacco: Never  Vaping Use   Vaping status: Former   Substances: Nicotine, Flavoring  Substance Use Topics   Alcohol use: Not Currently    Comment: occasionally   Drug use: Not Currently    Types: Marijuana    Comment: stopped earlier in pregnancy   No Known Allergies No medications prior to admission.    I have reviewed patient's Past Medical Hx, Surgical Hx, Family Hx, Social Hx, medications and allergies.   ROS  Pertinent items noted in HPI and remainder of comprehensive ROS otherwise negative.   PHYSICAL EXAM   Patient Vitals for the past 24 hrs:  BP Temp Temp src Pulse Resp SpO2 Height Weight  09/14/23 1809 122/67 98 F (36.7 C) Oral 87 16 98 % -- --  09/14/23 1601 115/63 98 F (36.7 C)  Oral 88 20 100 % 5\' 3"  (1.6 m) 79.4 kg    Constitutional: Well-developed, well-nourished female in no acute distress but appears very anxious  Cardiovascular: normal rate & rhythm, warm and well-perfused Respiratory: normal effort, no problems with respiration noted GI: Abd soft, non-tender, gravid, no guarding, no rebound, no rigidity on palpation MS: Extremities nontender, no edema, normal ROM Neurologic: Alert and oriented x 4.  GU: no CVA tenderness Pelvic:  deferred      Fetal Tracing: Cat 1, reactive for GA Baseline: 140 Variability: moderate  Accelerations: present Decelerations: absent Toco: no ctx   Labs: Results for orders placed or performed during the hospital encounter of 09/14/23 (from the past 24 hours)  Urinalysis, Routine w reflex microscopic -Urine, Clean Catch     Status: Abnormal   Collection Time: 09/14/23  3:59 PM  Result Value Ref Range   Color, Urine YELLOW YELLOW   APPearance CLOUDY (A) CLEAR   Specific Gravity, Urine 1.019 1.005 - 1.030   pH 7.0 5.0 - 8.0   Glucose, UA NEGATIVE NEGATIVE mg/dL   Hgb urine dipstick NEGATIVE NEGATIVE   Bilirubin Urine NEGATIVE NEGATIVE   Ketones, ur 80 (A) NEGATIVE mg/dL   Protein,  ur NEGATIVE NEGATIVE mg/dL   Nitrite NEGATIVE NEGATIVE   Leukocytes,Ua NEGATIVE NEGATIVE  CBC     Status: Abnormal   Collection Time: 09/14/23  4:40 PM  Result Value Ref Range   WBC 8.1 4.0 - 10.5 K/uL   RBC 3.19 (L) 3.87 - 5.11 MIL/uL   Hemoglobin 9.3 (L) 12.0 - 15.0 g/dL   HCT 13.2 (L) 44.0 - 10.2 %   MCV 88.1 80.0 - 100.0 fL   MCH 29.2 26.0 - 34.0 pg   MCHC 33.1 30.0 - 36.0 g/dL   RDW 72.5 36.6 - 44.0 %   Platelets 224 150 - 400 K/uL   nRBC 0.0 0.0 - 0.2 %  Comprehensive metabolic panel     Status: Abnormal   Collection Time: 09/14/23  4:40 PM  Result Value Ref Range   Sodium 134 (L) 135 - 145 mmol/L   Potassium 3.7 3.5 - 5.1 mmol/L   Chloride 105 98 - 111 mmol/L   CO2 21 (L) 22 - 32 mmol/L   Glucose, Bld 72 70 - 99 mg/dL    BUN <5 (L) 6 - 20 mg/dL   Creatinine, Ser 3.47 0.44 - 1.00 mg/dL   Calcium 8.6 (L) 8.9 - 10.3 mg/dL   Total Protein 6.6 6.5 - 8.1 g/dL   Albumin 3.1 (L) 3.5 - 5.0 g/dL   AST 13 (L) 15 - 41 U/L   ALT 11 0 - 44 U/L   Alkaline Phosphatase 52 38 - 126 U/L   Total Bilirubin 0.6 0.0 - 1.2 mg/dL   GFR, Estimated >42 >59 mL/min   Anion gap 8 5 - 15  Magnesium     Status: None   Collection Time: 09/14/23  4:40 PM  Result Value Ref Range   Magnesium 1.7 1.7 - 2.4 mg/dL    Imaging:  No results found.  MDM & MAU COURSE  MDM:  HIGH  N/V in pregnancy Labs ordered to check for electrolyte abnormalities ( UA c/w 80 of ketones) Hgb 9.3 (Anemia - Rx at discharge to supplement)  CMP and magnesium unremarkable  Likely dehydration from continuous N/V IVF's Antiemetics IV Benadryl  IVP administered d/t mild anxiety attack after receiving Reglan IVP which quickly resolved after benadryl  administered  Plan for PO challenge  NST for GA   Patient reassessed and PO challenge tolerated well and patient reported feeling much better and requesting discharge    MAU Course: Orders Placed This Encounter  Procedures   Urinalysis, Routine w reflex microscopic -Urine, Clean Catch   CBC   Comprehensive metabolic panel   Magnesium   Discharge patient Discharge disposition: 01-Home or Self Care; Discharge patient date: 09/14/2023   Meds ordered this encounter  Medications   lactated ringers bolus 1,000 mL   metoCLOPramide (REGLAN) injection 10 mg   famotidine (PEPCID) IVPB 20 mg premix   diphenhydrAMINE  (BENADRYL ) injection 25 mg   diphenhydrAMINE  (BENADRYL ) 50 MG/ML injection    Mickeal Aland L: cabinet override   famotidine (PEPCID) 20 MG tablet    Sig: Take 1 tablet (20 mg total) by mouth 2 (two) times daily.    Dispense:  20 tablet    Refill:  0    Supervising Provider:   PRATT, TANYA S [[id number]]   ferrous sulfate 325 (65 FE) MG tablet    Sig: Take 1 tablet (325 mg total) by mouth daily.     Dispense:  30 tablet    Refill:  0    Supervising Provider:   PRATT, TANYA  S [[id number]]    I have reviewed the patient chart and performed the physical exam . I have ordered & interpreted the lab results and reviewed and interpreted the NST Medications ordered as stated below.  A/P as described below.  Counseling and education provided and patient agreeable  with plan as described below. Verbalized understanding.    ASSESSMENT   1. Nausea and vomiting in pregnancy   2. Dehydration during pregnancy   3. [redacted] weeks gestation of pregnancy   4. Heartburn during pregnancy in third trimester   5. Iron deficiency anemia, unspecified iron deficiency anemia type     PLAN  Discharge home in stable condition with return precautions.   See AVS for full description of information given to the patient including both verbal and written. Patient verbalized understanding and agrees with the plan as described above.     Follow-up Information     [facility] Follow up.   Why: As scheduled for ongoing prenatal care Contact information: [address] [place]  [address] [phone]                Allergies as of 09/14/2023   No Known Allergies      Medication List     TAKE these medications    famotidine 20 MG tablet Commonly known as: Pepcid Take 1 tablet (20 mg total) by mouth 2 (two) times daily.   ferrous sulfate 325 (65 FE) MG tablet Take 1 tablet (325 mg total) by mouth daily.   ondansetron  4 MG disintegrating tablet Commonly known as: ZOFRAN -ODT Take 1 tablet (4 mg total) by mouth every 8 (eight) hours as needed for nausea or vomiting.   pantoprazole  40 MG tablet Commonly known as: Protonix  Take 1 tablet (40 mg total) by mouth daily.   prenatal multivitamin Tabs tablet Take 1 tablet by mouth daily at 12 noon.   promethazine  12.5 MG tablet Commonly known as: PHENERGAN  Take 1 tablet (12.5 mg total) by mouth every 6 (six) hours  as needed for nausea or vomiting.        Debbe Fail, MSN, [facility], [facility] for [employer]

## 2023-09-14 NOTE — ED Notes (Signed)
 Waiting in lobby for friend that is coming to pick her up and go to maternity admissions

## 2023-09-17 ENCOUNTER — Encounter (HOSPITAL_COMMUNITY): Payer: Self-pay | Admitting: Obstetrics and Gynecology

## 2023-09-17 ENCOUNTER — Observation Stay (HOSPITAL_COMMUNITY)
Admission: AD | Admit: 2023-09-17 | Discharge: 2023-09-19 | Disposition: A | Discharge status: Home / Self Care | Payer: Self-pay | Attending: Family Medicine | Admitting: Family Medicine

## 2023-09-17 DIAGNOSIS — O211 Hyperemesis gravidarum with metabolic disturbance: Secondary | ICD-10-CM

## 2023-09-17 DIAGNOSIS — Z3A27 27 weeks gestation of pregnancy: Secondary | ICD-10-CM | POA: Diagnosis not present

## 2023-09-17 DIAGNOSIS — O219 Vomiting of pregnancy, unspecified: Secondary | ICD-10-CM | POA: Diagnosis present

## 2023-09-17 DIAGNOSIS — Z3482 Encounter for supervision of other normal pregnancy, second trimester: Secondary | ICD-10-CM

## 2023-09-17 DIAGNOSIS — Z3401 Encounter for supervision of normal first pregnancy, first trimester: Secondary | ICD-10-CM

## 2023-09-17 DIAGNOSIS — O210 Mild hyperemesis gravidarum: Secondary | ICD-10-CM | POA: Diagnosis present

## 2023-09-17 DIAGNOSIS — O09899 Supervision of other high risk pregnancies, unspecified trimester: Secondary | ICD-10-CM

## 2023-09-17 LAB — URINALYSIS, ROUTINE W REFLEX MICROSCOPIC
Bilirubin Urine: NEGATIVE
Glucose, UA: NEGATIVE mg/dL
Hgb urine dipstick: NEGATIVE
Ketones, ur: 80 mg/dL — AB
Nitrite: NEGATIVE
Protein, ur: 100 mg/dL — AB
Specific Gravity, Urine: 1.032 — ABNORMAL HIGH (ref 1.005–1.030)
Squamous Epithelial / HPF: >50 /HPF (ref 0–5)
pH: 5 (ref 5.0–8.0)

## 2023-09-17 MED ORDER — PROMETHAZINE HCL 25 MG/ML IJ SOLN
25.0000 mg | Freq: Once | INTRAMUSCULAR | Status: AC
  Administered 2023-09-17: 25 mg via INTRAVENOUS
  Filled 2023-09-17: qty 1

## 2023-09-17 MED ORDER — LACTATED RINGERS IV BOLUS: 500.0000 mL | Freq: Once | INTRAVENOUS | Status: DC

## 2023-09-17 MED ORDER — SODIUM CHLORIDE 0.9 % IV SOLN
8.0000 mg | Freq: Once | INTRAVENOUS | Status: AC
Start: 2023-09-17 — End: 2023-09-17
  Administered 2023-09-17: 8 mg via INTRAVENOUS
  Filled 2023-09-17: qty 4

## 2023-09-17 MED ORDER — LACTATED RINGERS IV BOLUS
1000.0000 mL | Freq: Once | INTRAVENOUS | Status: AC
Start: 2023-09-17 — End: 2023-09-17
  Administered 2023-09-17: 1000 mL via INTRAVENOUS

## 2023-09-17 MED ORDER — PANTOPRAZOLE SODIUM 40 MG IV SOLR
40.0000 mg | Freq: Once | INTRAVENOUS | Status: AC
  Administered 2023-09-17: 40 mg via INTRAVENOUS
  Filled 2023-09-17: qty 10

## 2023-09-17 MED ORDER — SCOPOLAMINE 1 MG/3DAYS TD PT72
1.0000 | MEDICATED_PATCH | Freq: Once | TRANSDERMAL | Status: DC
  Administered 2023-09-17: 1.5 mg via TRANSDERMAL
  Filled 2023-09-17: qty 1

## 2023-09-17 NOTE — MAU Provider Note (Incomplete)
 Chief Complaint:  Nausea and Emesis   Event Date/Time   First Provider Initiated Contact with Patient 09/17/23 2041        HPI: Selena Dawson is a 23 y.o. G1P0 at [redacted]w[redacted]d who presents to maternity admissions reporting exacerbation of N/V.  Has had it the entire pregnancy.  Reports vomiting about 10 times in the past 24 hours.  Has been prescribed Phenergan  12.5 mg and Zofran  4 mg which have not helped.  Also reports a few soft stools per day for the past few days.  Denies fever, chills, sick contacts.  Associated signs and symptoms: Denies fever, chills, sick contacts.  Denies contractions, leakage of fluid or vaginal bleeding. Good fetal movement.   Pregnancy Course:   Past Medical History:  Diagnosis Date  . Medical history non-contributory    OB History  Gravida Para Term Preterm AB Living  1       SAB IAB Ectopic Multiple Live Births          # Outcome Date GA Lbr Len/2nd Weight Sex Type Anes PTL Lv  1 Current            Past Surgical History:  Procedure Laterality Date  . NO PAST SURGERIES     Family History  Problem Relation Age of Onset  . Diabetes Mother    Social History   Tobacco Use  . Smoking status: Never  . Smokeless tobacco: Never  Vaping Use  . Vaping status: Former  . Substances: Nicotine, Flavoring  Substance Use Topics  . Alcohol use: Not Currently    Comment: occasionally  . Drug use: Not Currently    Types: Marijuana    Comment: stopped earlier in pregnancy   No Known Allergies Medications Prior to Admission  Medication Sig Dispense Refill Last Dose/Taking  . famotidine  (PEPCID ) 20 MG tablet Take 1 tablet (20 mg total) by mouth 2 (two) times daily. 20 tablet 0 09/17/2023 at  6:30 PM  . ferrous sulfate  325 (65 FE) MG tablet Take 1 tablet (325 mg total) by mouth daily. 30 tablet 0 09/17/2023 at  6:30 PM  . pantoprazole  (PROTONIX ) 40 MG tablet Take 1 tablet (40 mg total) by mouth daily. 30 tablet 3 09/17/2023 at  6:30 PM  . ondansetron  (ZOFRAN -ODT) 4  MG disintegrating tablet Take 1 tablet (4 mg total) by mouth every 8 (eight) hours as needed for nausea or vomiting. 60 tablet 0   . Prenatal Vit-Fe Fumarate-FA (PRENATAL MULTIVITAMIN) TABS tablet Take 1 tablet by mouth daily at 12 noon.     . promethazine  (PHENERGAN ) 12.5 MG tablet Take 1 tablet (12.5 mg total) by mouth every 6 (six) hours as needed for nausea or vomiting. 30 tablet 0     I have reviewed patient's Past Medical Hx, Surgical Hx, Family Hx, Social Hx, medications and allergies.   ROS:  Review of Systems  Physical Exam  Patient Vitals for the past 24 hrs:  BP Temp Pulse Resp SpO2  09/17/23 1939 -- -- (!) 103 -- --  09/17/23 1935 -- -- -- -- 100 %  09/17/23 1933 127/64 98.2 F (36.8 C) (!) 122 18 100 %   Constitutional: Well-developed, well-nourished female in no acute distress.  Cardiovascular: normal rate Respiratory: normal effort GI: Abd soft, non-tender, gravid appropriate for gestational age. Pos BS x 4 MS: Extremities nontender, no edema, normal ROM Neurologic: Alert and oriented x 4.  GU: Neg CVAT.  Pelvic: NEFG, physiologic discharge, no blood, cervix clean. No CMT  FHT:  Baseline 140 , moderate variability, accelerations present, no decelerations. Intermittently tracing MHR when pt vomiting.  Contractions: None   Labs: Results for orders placed or performed during the hospital encounter of 09/17/23 (from the past 24 hours)  Urinalysis, Routine w reflex microscopic -Urine, Clean Catch     Status: Abnormal   Collection Time: 09/17/23  7:27 PM  Result Value Ref Range   Color, Urine AMBER (A) YELLOW   APPearance CLOUDY (A) CLEAR   Specific Gravity, Urine 1.032 (H) 1.005 - 1.030   pH 5.0 5.0 - 8.0   Glucose, UA NEGATIVE NEGATIVE mg/dL   Hgb urine dipstick NEGATIVE NEGATIVE   Bilirubin Urine NEGATIVE NEGATIVE   Ketones, ur 80 (A) NEGATIVE mg/dL   Protein, ur 621 (A) NEGATIVE mg/dL   Nitrite NEGATIVE NEGATIVE   Leukocytes,Ua MODERATE (A) NEGATIVE    RBC / HPF 0-5 0 - 5 RBC/hpf   WBC, UA 21-50 0 - 5 WBC/hpf   Bacteria, UA RARE (A) NONE SEEN   Squamous Epithelial / HPF >50 0 - 5 /HPF   Mucus PRESENT     Imaging:  US  MFM OB FOLLOW UP Result Date: 08/28/2023 ----------------------------------------------------------------------  OBSTETRICS REPORT                       (Signed Final 08/28/2023 03:28 pm) ---------------------------------------------------------------------- Patient Info  ID #:       [id number]                          D.O.B.:  01/07/2001 (23 yrs)(F)  Name:       [facility]                      Visit Date: 08/28/2023 01:07 pm ---------------------------------------------------------------------- Performed By  Attending:        Sal Crass MD         Ref. Address:     [facility]  Performed By:     Marybeth Smock         [facility]  Referred By:      Carles Cheadle                    CONSTANT MD ---------------------------------------------------------------------- Orders  #  Description  Code        Ordered By  1  US  MFM OB FOLLOW UP                   [id number]    Penney Bowling ----------------------------------------------------------------------  #  Order #                     Accession #                Episode #  1  [id number]                   [id number]                 [id number] ---------------------------------------------------------------------- Indications  Obesity complicating pregnancy, second         O99.212  trimester (BMI 30)  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 Cord Insertion:      Visualized  Amniotic Fluid  AFI FV:      Within normal limits  AFI Sum(cm)     %Tile       Largest Pocket(cm)  18.49           74          5.4  RUQ(cm)       RLQ(cm)       LUQ(cm)        LLQ(cm)  5.4           2.86          5.24           4.99 ---------------------------------------------------------------------- Biometry  BPD:      60.6  mm     G. Age:  24w 5d         68  %    CI:        75.18   %    70 - 86                                                          FL/HC:      17.8   %    18.7 - 20.9  HC:      221.7  mm     G. Age:  24w 1d         39  %    HC/AC:      1.18        1.05 - 1.21  AC:      188.4  mm     G. Age:  23w 4d  29  %    FL/BPD:     65.2   %    71 - 87  FL:       39.5  mm     G. Age:  22w 5d          8  %    FL/AC:      21.0   %    20 - 24  LV:        5.5  mm  Est. FW:     589  gm      1 lb 5 oz     17  % ---------------------------------------------------------------------- OB History  Blood Type:   O+  Maternal Racial/Ethnic Group:   Black (non-Hispanic)  Gravidity:    1         Term:   0        Prem:   0        SAB:   0  TOP:          0       Ectopic:  0        Living: 0 ---------------------------------------------------------------------- Gestational Age  LMP:           24w 0d        Date:  03/13/23                 EDD:   12/18/23  U/S Today:     23w 6d                                        EDD:   12/19/23  Best:          24w 0d     Det. By:  LMP  (03/13/23)          EDD:   12/18/23 ---------------------------------------------------------------------- Anatomy  Cranium:               Previously seen        Aortic Arch:            Previously seen  Cavum:                 Previously seen         Ductal Arch:            Previously seen  Ventricles:            Appears normal         Diaphragm:              Appears normal  Choroid Plexus:        Previously seen        Stomach:                Appears normal, left                                                                        sided  Cerebellum:            Previously seen        Abdomen:  Previously seen  Posterior Fossa:       Previously seen        Abdominal Wall:         Previously seen  Face:                  Orbits and profile     Cord Vessels:           Previously seen                         previously seen  Lips:                  Previously seen        Kidneys:                Appear normal  Thoracic:              Previously seen        Bladder:                Appears normal  Heart:                 Appears normal         Spine:                  Previously seen                         (4CH, axis, and                         situs)  RVOT:                  Appears normal         Upper Extremities:      Previously seen  LVOT:                  Appears normal         Lower Extremities:      Previously seen  Other:  4ch, rvot and crossing seen and appear normal ---------------------------------------------------------------------- Cervix Uterus Adnexa  Cervix  Not visualized (advanced GA >24wks)  Uterus  Single fibroid noted, see table below.  Right Ovary  Size(cm)     2.95   x   1.96   x  1.82      Vol(ml): 5.51  Within normal limits.  Left Ovary  Size(cm)     4.24   x   1.6    x  2.36      Vol(ml): 8.38  Within normal limits.  Cul De Sac  No free fluid seen.  Adnexa  No abnormality visualized ---------------------------------------------------------------------- Myomas  Site                     L(cm)      W(cm)      D(cm)       Location  Mid anterior             2.94       1.92       3.25 ----------------------------------------------------------------------  Blood Flow                  RI       PI       Comments  ---------------------------------------------------------------------- Comments  Adraine Lites is currently at 24 weeks and 0 days.  She  was  seen as the views of the fetal anatomy were unable to be fully  visualized during her last exam.  She denies any problems  since her last exam.  The overall EFW obtained today of 1 pound 5 ounces  measures at the 17th percentile for her gestational age.  There was normal amniotic fluid noted.  The views of the fetal anatomy were visualized today.  There  were no obvious anomalies noted.  The limitations of ultrasound in the detection of all anomalies  was discussed.  As the views of the fetal anatomy were visualized today, no  further exams were scheduled in our office.  The patient stated that all of her questions were answered  today.  A total of 20 minutes was spent counseling and coordinating  the care for this patient.  Greater than 50% of the time was  spent in direct face-to-face contact. ----------------------------------------------------------------------                   Sal Crass, MD Electronically Signed Final Report   08/28/2023 03:28 pm ----------------------------------------------------------------------    MAU Course: Orders Placed This Encounter  Procedures  . Urinalysis, Routine w reflex microscopic -Urine, Clean Catch  . Insert peripheral IV   Meds ordered this encounter  Medications  . lactated ringers  bolus 1,000 mL  . promethazine  (PHENERGAN ) 25 mg in sodium chloride  0.9 % 1,000 mL infusion   11: 40 5 PM: After completing Phenergan  25 mg and Zofran  8 mg patient vomited after trying ice chips.  Still having nausea and retching.  Will try scopolamine patch and Protonix .  MDM:  Assessment: 1. Encounter for supervision of normal first pregnancy in first trimester   2. Cystic fibrosis carrier, antepartum     Plan: Discharge home in stable condition.  *** Labor precautions and fetal kick counts   Allergies as of 09/17/2023   No Known  Allergies   Med Rec must be completed prior to using this [facility]***       Felipe Horton, Josimar Corning , CNM 09/17/2023 8:40 PM

## 2023-09-17 NOTE — MAU Provider Note (Signed)
 Chief Complaint:  Nausea and Emesis   Event Date/Time   First Provider Initiated Contact with Patient 09/17/23 2041       HPI: Selena Dawson is a 23 y.o. G1P0 at [redacted]w[redacted]d who presents to maternity admissions reporting exacerbation of N/V.  Has had it the entire pregnancy.  Reports vomiting about 10 times in the past 24 hours.  Has been prescribed Phenergan  12.5 mg and Zofran  4 mg which have not helped.  Also reports a few soft stools per day for the past few days.  Denies fever, chills, sick contacts.  Associated signs and symptoms: Denies fever, chills, sick contacts.  Denies contractions, leakage of fluid or vaginal bleeding. Good fetal movement.   Pregnancy Course:   Past Medical History:  Diagnosis Date   Medical history non-contributory    OB History  Gravida Para Term Preterm AB Living  1       SAB IAB Ectopic Multiple Live Births          # Outcome Date GA Lbr Len/2nd Weight Sex Type Anes PTL Lv  1 Current            Past Surgical History:  Procedure Laterality Date   NO PAST SURGERIES     Family History  Problem Relation Age of Onset   Diabetes Mother    Social History   Tobacco Use   Smoking status: Never   Smokeless tobacco: Never  Vaping Use   Vaping status: Former   Substances: Nicotine, Flavoring  Substance Use Topics   Alcohol use: Not Currently    Comment: occasionally   Drug use: Not Currently    Types: Marijuana    Comment: stopped earlier in pregnancy   No Known Allergies Medications Prior to Admission  Medication Sig Dispense Refill Last Dose/Taking   famotidine  (PEPCID ) 20 MG tablet Take 1 tablet (20 mg total) by mouth 2 (two) times daily. 20 tablet 0 09/17/2023 at  6:30 PM   ferrous sulfate  325 (65 FE) MG tablet Take 1 tablet (325 mg total) by mouth daily. 30 tablet 0 09/17/2023 at  6:30 PM   pantoprazole  (PROTONIX ) 40 MG tablet Take 1 tablet (40 mg total) by mouth daily. 30 tablet 3 09/17/2023 at  6:30 PM   ondansetron  (ZOFRAN -ODT) 4 MG  disintegrating tablet Take 1 tablet (4 mg total) by mouth every 8 (eight) hours as needed for nausea or vomiting. 60 tablet 0    Prenatal Vit-Fe Fumarate-FA (PRENATAL MULTIVITAMIN) TABS tablet Take 1 tablet by mouth daily at 12 noon.      promethazine  (PHENERGAN ) 12.5 MG tablet Take 1 tablet (12.5 mg total) by mouth every 6 (six) hours as needed for nausea or vomiting. 30 tablet 0     I have reviewed patient's Past Medical Hx, Surgical Hx, Family Hx, Social Hx, medications and allergies.   ROS:  Review of Systems  Constitutional:  Negative for appetite change, chills and fever.  Gastrointestinal:  Positive for diarrhea (few loose stools over thepast week. Solid stool yesterday), nausea and vomiting. Negative for abdominal pain.  Genitourinary:  Negative for dysuria, flank pain, frequency, hematuria, urgency and vaginal bleeding.  Neurological:  Positive for weakness.    Physical Exam  Patient Vitals for the past 24 hrs:  BP Temp Pulse Resp SpO2  09/17/23 1939 -- -- (!) 103 -- --  09/17/23 1935 -- -- -- -- 100 %  09/17/23 1933 127/64 98.2 F (36.8 C) (!) 122 18 100 %   Constitutional: Well-developed, well-nourished female  in mild distress.  Actively vomiting. Cardiovascular: Mild tachycardia Respiratory: normal effort GI: Abd soft, non-tender, gravid appropriate for gestational age.  MS: Extremities nontender, no edema, normal ROM Neurologic: Alert and oriented x 4.  GU: Neg CVAT.  Pelvic: Deferred    FHT:  Baseline 140 , moderate variability, accelerations present, no decelerations. Intermittently tracing MHR when pt vomiting.  Contractions: None   Labs: Results for orders placed or performed during the hospital encounter of 09/17/23 (from the past 24 hours)  Urinalysis, Routine w reflex microscopic -Urine, Clean Catch     Status: Abnormal   Collection Time: 09/17/23  7:27 PM  Result Value Ref Range   Color, Urine AMBER (A) YELLOW   APPearance CLOUDY (A) CLEAR   Specific  Gravity, Urine 1.032 (H) 1.005 - 1.030   pH 5.0 5.0 - 8.0   Glucose, UA NEGATIVE NEGATIVE mg/dL   Hgb urine dipstick NEGATIVE NEGATIVE   Bilirubin Urine NEGATIVE NEGATIVE   Ketones, ur 80 (A) NEGATIVE mg/dL   Protein, ur 409 (A) NEGATIVE mg/dL   Nitrite NEGATIVE NEGATIVE   Leukocytes,Ua MODERATE (A) NEGATIVE   RBC / HPF 0-5 0 - 5 RBC/hpf   WBC, UA 21-50 0 - 5 WBC/hpf   Bacteria, UA RARE (A) NONE SEEN   Squamous Epithelial / HPF >50 0 - 5 /HPF   Mucus PRESENT     Imaging:  N/A  MAU Course: Orders Placed This Encounter  Procedures   Urinalysis, Routine w reflex microscopic -Urine, Clean Catch   Insert peripheral IV   Meds ordered this encounter  Medications   lactated ringers  bolus 1,000 mL   promethazine  (PHENERGAN ) 25 mg in sodium chloride  0.9 % 1,000 mL infusion   ondansetron  (ZOFRAN ) 8 mg in sodium chloride  0.9 % 50 mL IVPB   scopolamine  (TRANSDERM-SCOP) 1 MG/3DAYS 1.5 mg   pantoprazole  (PROTONIX ) injection 40 mg   DISCONTD: lactated ringers  bolus 500 mL   DISCONTD: ondansetron  (ZOFRAN ) 8 mg in sodium chloride  0.9 % 50 mL IVPB   DISCONTD: promethazine  (PHENERGAN ) 25 mg in dextrose  5% lactated ringers  1,000 mL infusion   ondansetron  (ZOFRAN -ODT) disintegrating tablet 8 mg   promethazine  (PHENERGAN ) 25 mg in dextrose  5% lactated ringers  1,000 mL infusion   dexamethasone  (DECADRON ) injection 10 mg   11:45 PM: After completing Phenergan  25 mg and Zofran  8 mg patient vomited after trying ice chips.  Still having nausea and retching.  Will try scopolamine  patch and Protonix .  3:15 AM: Pt still reports nausea, unable to keep down ice chips. Due for another dose of Zofran  and Phenergan . Wants to try PO. Zofran  ODT given and pt vomited soon after.  Still having significant nausea and vomiting small amounts.  Will give Phenergan  IV.  May need to be observed on OB specialty care for ongoing IV antiemetics.  5:20: Still very nauseous after Phenergan  and Decadron .  Afraid to even try  ice chips due to ongoing nausea.  Discussed with Dr. Alto Atta.  Will observe on OB specialty care and continue IV antiemetics and fluids.  MDM: - Exacerbation of nausea and vomiting with evidence of dehydration.  Partial but inadequate response to Zofran , Phenergan , scopolamine , Decadron , Protonix  in MAU.  Not tolerating p.o.'s so patient is not felt to be a good candidate for discharge at this time.  Will observe on OB specialty care per consult with Dr. Alto Atta.  Assessment: 1. Hyperemesis gravidarum with dehydration   2. [redacted] weeks gestation of pregnancy    Plan: Observe on OB specialty  care per consult with Dr. Alto Atta IV Phenergan , Zofran  as needed Consider steroid taper if nausea and vomiting remain intractable patient cannot tolerate p.o.'s.   Felipe Horton, Ozro Russett , CNM 09/18/2023 5:22 AM

## 2023-09-17 NOTE — MAU Note (Signed)
 Selena Dawson is a 23 y.o. at [redacted]w[redacted]d here in MAU reporting: "severe nausea and vomiting". Reports she is unable to keep anything down. Was seen here a few days ago and given meds but she is unable to keep them down. Reports decreased fetal movement today and lower abd pain when she is vomiting. Pt is actively vomiting in triage.   Onset of complaint: 0/10 Pain score: 0 Vitals:   09/17/23 1935 09/17/23 1939  BP:    Pulse:  (!) 103  Resp:    Temp:    SpO2: 100%      FHT: 126  Lab orders placed from triage: ua

## 2023-09-18 ENCOUNTER — Encounter (HOSPITAL_COMMUNITY): Payer: Self-pay | Admitting: Obstetrics and Gynecology

## 2023-09-18 DIAGNOSIS — O210 Mild hyperemesis gravidarum: Secondary | ICD-10-CM | POA: Diagnosis present

## 2023-09-18 DIAGNOSIS — O219 Vomiting of pregnancy, unspecified: Secondary | ICD-10-CM | POA: Diagnosis not present

## 2023-09-18 MED ORDER — KETOROLAC TROMETHAMINE 30 MG/ML IJ SOLN
30.0000 mg | Freq: Four times a day (QID) | INTRAMUSCULAR | Status: DC | PRN
  Administered 2023-09-18: 30 mg via INTRAVENOUS
  Filled 2023-09-18: qty 1

## 2023-09-18 MED ORDER — SODIUM CHLORIDE 0.9 % IV SOLN
8.0000 mg | Freq: Once | INTRAVENOUS | Status: DC
  Filled 2023-09-18: qty 4

## 2023-09-18 MED ORDER — CALCIUM CARBONATE ANTACID 500 MG PO CHEW: 2.0000 | CHEWABLE_TABLET | ORAL | Status: DC | PRN

## 2023-09-18 MED ORDER — DEXTROSE IN LACTATED RINGERS 5 % IV SOLN
25.0000 mg | Freq: Once | INTRAVENOUS | Status: AC
  Administered 2023-09-18: 25 mg via INTRAVENOUS
  Filled 2023-09-18: qty 1

## 2023-09-18 MED ORDER — PROMETHAZINE HCL 25 MG/ML IJ SOLN
25.0000 mg | Freq: Once | INTRAVENOUS | Status: DC
  Filled 2023-09-18: qty 1

## 2023-09-18 MED ORDER — LACTATED RINGERS IV SOLN
125.0000 mL/h | INTRAVENOUS | Status: AC
  Administered 2023-09-18: 125 mL/h via INTRAVENOUS

## 2023-09-18 MED ORDER — SODIUM CHLORIDE 0.9 % IV SOLN
8.0000 mg | Freq: Three times a day (TID) | INTRAVENOUS | Status: DC | PRN
  Administered 2023-09-18: 8 mg via INTRAVENOUS
  Filled 2023-09-18: qty 4

## 2023-09-18 MED ORDER — ONDANSETRON 4 MG PO TBDP: 8.0000 mg | ORAL_TABLET | Freq: Three times a day (TID) | ORAL | Status: DC | PRN

## 2023-09-18 MED ORDER — SODIUM CHLORIDE 0.9 % IV SOLN
25.0000 mg | Freq: Four times a day (QID) | INTRAVENOUS | Status: DC | PRN
  Administered 2023-09-18 (×2): 25 mg via INTRAVENOUS
  Filled 2023-09-18 (×2): qty 1

## 2023-09-18 MED ORDER — FAMOTIDINE IN NACL 20-0.9 MG/50ML-% IV SOLN
20.0000 mg | Freq: Two times a day (BID) | INTRAVENOUS | Status: DC
  Administered 2023-09-18 (×2): 20 mg via INTRAVENOUS
  Filled 2023-09-18 (×2): qty 50

## 2023-09-18 MED ORDER — DEXAMETHASONE SODIUM PHOSPHATE 10 MG/ML IJ SOLN
10.0000 mg | Freq: Once | INTRAMUSCULAR | Status: AC
Start: 2023-09-18 — End: 2023-09-18
  Administered 2023-09-18: 10 mg via INTRAVENOUS
  Filled 2023-09-18: qty 1

## 2023-09-18 MED ORDER — LACTATED RINGERS IV SOLN
INTRAVENOUS | Status: DC
Start: 2023-09-18 — End: 2023-09-19

## 2023-09-18 MED ORDER — FAMOTIDINE 20 MG PO TABS
20.0000 mg | ORAL_TABLET | Freq: Two times a day (BID) | ORAL | Status: DC
  Administered 2023-09-19: 20 mg via ORAL
  Filled 2023-09-18: qty 1

## 2023-09-18 MED ORDER — ACETAMINOPHEN 325 MG PO TABS
650.0000 mg | ORAL_TABLET | ORAL | Status: DC | PRN
  Administered 2023-09-18: 650 mg via ORAL
  Filled 2023-09-18: qty 2

## 2023-09-18 MED ORDER — ONDANSETRON 4 MG PO TBDP
8.0000 mg | ORAL_TABLET | Freq: Once | ORAL | Status: AC
Start: 2023-09-18 — End: 2023-09-18
  Administered 2023-09-18: 8 mg via ORAL
  Filled 2023-09-18: qty 2

## 2023-09-18 NOTE — H&P (Signed)
 Chief Complaint:  Nausea and Emesis   Event Date/Time   First Provider Initiated Contact with Patient 09/17/23 2041       HPI: Selena Dawson is a 23 y.o. G1P0 at [redacted]w[redacted]d who presents to maternity admissions reporting exacerbation of N/V.  Has had it the entire pregnancy.  Reports vomiting about 10 times in the past 24 hours.  Has been prescribed Phenergan  12.5 mg and Zofran  4 mg which have not helped.  Also reports a few soft stools per day for the past few days.  Denies fever, chills, sick contacts.  Associated signs and symptoms: Denies fever, chills, sick contacts.  Denies contractions, leakage of fluid or vaginal bleeding. Good fetal movement.   Pregnancy Course:  Patient Active Problem List   Diagnosis Date Noted   Uterine fibroids affecting pregnancy in second trimester 07/31/2023   Cystic fibrosis carrier, antepartum 06/21/2023   ASCUS with positive high risk HPV cervical 06/21/2023   Encounter for supervision of normal first pregnancy in first trimester 06/12/2023     Past Medical History:  Diagnosis Date   Medical history non-contributory    OB History  Gravida Para Term Preterm AB Living  1       SAB IAB Ectopic Multiple Live Births          # Outcome Date GA Lbr Len/2nd Weight Sex Type Anes PTL Lv  1 Current            Past Surgical History:  Procedure Laterality Date   NO PAST SURGERIES     Family History  Problem Relation Age of Onset   Diabetes Mother    Social History   Tobacco Use   Smoking status: Never   Smokeless tobacco: Never  Vaping Use   Vaping status: Former   Substances: Nicotine, Flavoring  Substance Use Topics   Alcohol use: Not Currently    Comment: occasionally   Drug use: Not Currently    Types: Marijuana    Comment: stopped earlier in pregnancy   No Known Allergies Medications Prior to Admission  Medication Sig Dispense Refill Last Dose/Taking   famotidine  (PEPCID ) 20 MG tablet Take 1 tablet (20 mg total) by mouth 2 (two) times  daily. 20 tablet 0 09/17/2023 at  6:30 PM   ferrous sulfate  325 (65 FE) MG tablet Take 1 tablet (325 mg total) by mouth daily. 30 tablet 0 09/17/2023 at  6:30 PM   pantoprazole  (PROTONIX ) 40 MG tablet Take 1 tablet (40 mg total) by mouth daily. 30 tablet 3 09/17/2023 at  6:30 PM   ondansetron  (ZOFRAN -ODT) 4 MG disintegrating tablet Take 1 tablet (4 mg total) by mouth every 8 (eight) hours as needed for nausea or vomiting. 60 tablet 0    Prenatal Vit-Fe Fumarate-FA (PRENATAL MULTIVITAMIN) TABS tablet Take 1 tablet by mouth daily at 12 noon.      promethazine  (PHENERGAN ) 12.5 MG tablet Take 1 tablet (12.5 mg total) by mouth every 6 (six) hours as needed for nausea or vomiting. 30 tablet 0     I have reviewed patient's Past Medical Hx, Surgical Hx, Family Hx, Social Hx, medications and allergies.   ROS:  Review of Systems  Constitutional:  Negative for appetite change, chills and fever.  Gastrointestinal:  Positive for diarrhea (few loose stools over thepast week. Solid stool yesterday), nausea and vomiting. Negative for abdominal pain.  Genitourinary:  Negative for dysuria, flank pain, frequency, hematuria, urgency and vaginal bleeding.  Neurological:  Positive for weakness.    Physical  Exam  Patient Vitals for the past 24 hrs:  BP Temp Pulse Resp SpO2  09/17/23 1939 -- -- (!) 103 -- --  09/17/23 1935 -- -- -- -- 100 %  09/17/23 1933 127/64 98.2 F (36.8 C) (!) 122 18 100 %   Constitutional: Well-developed, well-nourished female in mild distress.  Actively vomiting. Cardiovascular: Mild tachycardia Respiratory: normal effort GI: Abd soft, non-tender, gravid appropriate for gestational age.  MS: Extremities nontender, no edema, normal ROM Neurologic: Alert and oriented x 4.  GU: Neg CVAT.  Pelvic: Deferred    FHT:  Baseline 140 , moderate variability, accelerations present, no decelerations. Intermittently tracing MHR when pt vomiting.  Contractions: None   Labs: Results for orders  placed or performed during the hospital encounter of 09/17/23 (from the past 24 hours)  Urinalysis, Routine w reflex microscopic -Urine, Clean Catch     Status: Abnormal   Collection Time: 09/17/23  7:27 PM  Result Value Ref Range   Color, Urine AMBER (A) YELLOW   APPearance CLOUDY (A) CLEAR   Specific Gravity, Urine 1.032 (H) 1.005 - 1.030   pH 5.0 5.0 - 8.0   Glucose, UA NEGATIVE NEGATIVE mg/dL   Hgb urine dipstick NEGATIVE NEGATIVE   Bilirubin Urine NEGATIVE NEGATIVE   Ketones, ur 80 (A) NEGATIVE mg/dL   Protein, ur 161 (A) NEGATIVE mg/dL   Nitrite NEGATIVE NEGATIVE   Leukocytes,Ua MODERATE (A) NEGATIVE   RBC / HPF 0-5 0 - 5 RBC/hpf   WBC, UA 21-50 0 - 5 WBC/hpf   Bacteria, UA RARE (A) NONE SEEN   Squamous Epithelial / HPF >50 0 - 5 /HPF   Mucus PRESENT     Imaging:  N/A  MAU Course: Orders Placed This Encounter  Procedures   Urinalysis, Routine w reflex microscopic -Urine, Clean Catch   Insert peripheral IV   Meds ordered this encounter  Medications   lactated ringers  bolus 1,000 mL   promethazine  (PHENERGAN ) 25 mg in sodium chloride  0.9 % 1,000 mL infusion   ondansetron  (ZOFRAN ) 8 mg in sodium chloride  0.9 % 50 mL IVPB   scopolamine  (TRANSDERM-SCOP) 1 MG/3DAYS 1.5 mg   pantoprazole  (PROTONIX ) injection 40 mg   DISCONTD: lactated ringers  bolus 500 mL   DISCONTD: ondansetron  (ZOFRAN ) 8 mg in sodium chloride  0.9 % 50 mL IVPB   DISCONTD: promethazine  (PHENERGAN ) 25 mg in dextrose  5% lactated ringers  1,000 mL infusion   ondansetron  (ZOFRAN -ODT) disintegrating tablet 8 mg   promethazine  (PHENERGAN ) 25 mg in dextrose  5% lactated ringers  1,000 mL infusion   dexamethasone  (DECADRON ) injection 10 mg   11:45 PM: After completing Phenergan  25 mg and Zofran  8 mg patient vomited after trying ice chips.  Still having nausea and retching.  Will try scopolamine  patch and Protonix .  3:15 AM: Pt still reports nausea, unable to keep down ice chips. Due for another dose of Zofran  and  Phenergan . Wants to try PO. Zofran  ODT given and pt vomited soon after.  Still having significant nausea and vomiting small amounts.  Will give Phenergan  IV.  May need to be observed on OB specialty care for ongoing IV antiemetics.  5:20: Still very nauseous after Phenergan  and Decadron .  Afraid to even try ice chips due to ongoing nausea.  Discussed with Dr. Alto Atta.  Will observe on OB specialty care and continue IV antiemetics and fluids.  MDM: - Exacerbation of nausea and vomiting with evidence of dehydration.  Partial but inadequate response to Zofran , Phenergan , scopolamine , Decadron , Protonix  in MAU.  Not  tolerating p.o.'s so patient is not felt to be a good candidate for discharge at this time.  Will observe on OB specialty care per consult with Dr. Alto Atta.  Assessment: 1. Hyperemesis gravidarum with dehydration   2. [redacted] weeks gestation of pregnancy    Plan: Observe on OB specialty care per consult with Dr. Alto Atta IV Phenergan , Zofran  as needed Consider steroid taper if nausea and vomiting remain intractable patient cannot tolerate p.o.'s.   Pieter Bride , CNM 09/18/2023 5:22 AM

## 2023-09-19 ENCOUNTER — Other Ambulatory Visit (HOSPITAL_COMMUNITY): Payer: Self-pay

## 2023-09-19 ENCOUNTER — Other Ambulatory Visit: Payer: Self-pay

## 2023-09-19 DIAGNOSIS — O210 Mild hyperemesis gravidarum: Secondary | ICD-10-CM | POA: Diagnosis not present

## 2023-09-19 DIAGNOSIS — Z3A27 27 weeks gestation of pregnancy: Secondary | ICD-10-CM | POA: Diagnosis not present

## 2023-09-19 DIAGNOSIS — O219 Vomiting of pregnancy, unspecified: Secondary | ICD-10-CM | POA: Diagnosis not present

## 2023-09-19 LAB — BASIC METABOLIC PANEL WITH GFR
Anion gap: 4 — ABNORMAL LOW (ref 5–15)
BUN: <5 mg/dL — ABNORMAL LOW (ref 6–20)
CO2: 16 mmol/L — ABNORMAL LOW (ref 22–32)
Calcium: 7.7 mg/dL — ABNORMAL LOW (ref 8.9–10.3)
Chloride: 113 mmol/L — ABNORMAL HIGH (ref 98–111)
Creatinine, Ser: 0.57 mg/dL (ref 0.44–1.00)
GFR, Estimated: >60 mL/min (ref >60)
Glucose, Bld: 74 mg/dL (ref 70–99)
Potassium: 3.5 mmol/L (ref 3.5–5.1)
Sodium: 133 mmol/L — ABNORMAL LOW (ref 135–145)

## 2023-09-19 MED ORDER — ONDANSETRON 8 MG PO TBDP
8.0000 mg | ORAL_TABLET | Freq: Three times a day (TID) | ORAL | 0 refills | Status: DC | PRN
  Filled 2023-09-19: qty 30, 10d supply, fill #0

## 2023-09-19 MED ORDER — PANTOPRAZOLE SODIUM 40 MG PO TBEC
40.0000 mg | DELAYED_RELEASE_TABLET | Freq: Every day | ORAL | 0 refills | Status: AC
Start: 2023-09-19 — End: ?
  Filled 2023-09-19: qty 30, 30d supply, fill #0

## 2023-09-19 MED ORDER — PROMETHAZINE HCL 12.5 MG PO TABS
12.5000 mg | ORAL_TABLET | Freq: Four times a day (QID) | ORAL | 0 refills | Status: DC | PRN
  Filled 2023-09-19: qty 30, 8d supply, fill #0

## 2023-09-19 MED ORDER — SCOPOLAMINE 1 MG/3DAYS TD PT72
1.0000 | MEDICATED_PATCH | Freq: Once | TRANSDERMAL | 0 refills | Status: AC
  Filled 2023-09-19: qty 10, 30d supply, fill #0

## 2023-09-19 NOTE — Plan of Care (Signed)
  Problem: Health Behavior/Discharge Planning: Goal: Ability to manage health-related needs will improve Outcome: Completed/Met   Problem: Clinical Measurements: Goal: Ability to maintain clinical measurements within normal limits will improve Outcome: Completed/Met Goal: Will remain free from infection Outcome: Completed/Met Goal: Diagnostic test results will improve Outcome: Completed/Met Goal: Respiratory complications will improve Outcome: Completed/Met Goal: Cardiovascular complication will be avoided Outcome: Completed/Met   Problem: Activity: Goal: Risk for activity intolerance will decrease Outcome: Completed/Met   Problem: Nutrition: Goal: Adequate nutrition will be maintained Outcome: Completed/Met   Problem: Coping: Goal: Level of anxiety will decrease Outcome: Completed/Met   Problem: Elimination: Goal: Will not experience complications related to bowel motility Outcome: Completed/Met Goal: Will not experience complications related to urinary retention Outcome: Completed/Met   Problem: Pain Managment: Goal: General experience of comfort will improve and/or be controlled Outcome: Completed/Met   Problem: Safety: Goal: Ability to remain free from injury will improve Outcome: Completed/Met   Problem: Skin Integrity: Goal: Risk for impaired skin integrity will decrease Outcome: Completed/Met   Problem: Education: Goal: Knowledge of disease or condition will improve Outcome: Completed/Met Goal: Knowledge of the prescribed therapeutic regimen will improve Outcome: Completed/Met Goal: Individualized Educational Video(s) Outcome: Completed/Met   Problem: Clinical Measurements: Goal: Complications related to the disease process, condition or treatment will be avoided or minimized Outcome: Completed/Met   Problem: Education: Goal: Knowledge of disease or condition will improve Outcome: Completed/Met Goal: Knowledge of the prescribed therapeutic regimen  will improve Outcome: Completed/Met   Problem: Bowel/Gastric: Goal: Occurences of nausea and/or vomiting will decrease Outcome: Completed/Met   Problem: Fluid Volume: Goal: Maintenance of adequate hydration will improve Outcome: Completed/Met   Problem: Nutritional: Goal: Achievement of adequate weight for body size and type will improve Outcome: Completed/Met

## 2023-09-19 NOTE — Discharge Summary (Signed)
 Antenatal Physician Discharge Summary  Patient ID: Minnie Legros MRN: [id number] DOB/AGE: 22-Apr-2001 22 y.o.  Admit date: 09/17/2023 Discharge date: 09/19/2023  Admission Diagnoses: hyperemesis gravidarum  Discharge Diagnoses: hyperemesis gravidarum, resolved  Prenatal Procedures: none  Consults: n/a  Hospital Course:  Genene Kilman is a 23 y.o. G1P0 with IUP at [redacted]w[redacted]d admitted for nausea and inability to keep down liquids. Pt received multiple anti emetics and has been able to tolerate food liquids. She was deemed stable for discharge to home with outpatient follow up.  Discharge Exam: Temp:  [97.7 F (36.5 C)-98.3 F (36.8 C)] 97.7 F (36.5 C) (05/20 0836) Pulse Rate:  [80-100] 82 (05/20 0836) Resp:  [18-30] 18 (05/20 0836) BP: (91-106)/(40-58) 98/51 (05/20 0836) SpO2:  [97 %-100 %] 100 % (05/20 0836) Weight:  [82.8 kg] 82.8 kg (05/20 0450) Physical Examination: CONSTITUTIONAL: Well-developed, well-nourished female in no acute distress.  HENT:  Normocephalic, atraumatic, External right and left ear normal. Oropharynx is clear and moist EYES: Conjunctivae and EOM are normal.  NECK: Normal range of motion, supple, no masses SKIN: Skin is warm and dry. No rash noted. Not diaphoretic. No erythema. No pallor. NEUROLGIC: Alert and oriented to person, place, and time. Normal reflexes, muscle tone coordination. No cranial nerve deficit noted. PSYCHIATRIC: Normal mood and affect. Normal behavior. Normal judgment and thought content. CARDIOVASCULAR: Normal heart rate noted, regular rhythm RESPIRATORY: Effort and breath sounds normal, no problems with respiration noted MUSCULOSKELETAL: Normal range of motion. No edema and no tenderness. 2+ distal pulses. ABDOMEN: Soft, nontender, nondistended, gravid. CERVIX:  deferred  Significant Diagnostic Studies:  Results for orders placed or performed during the hospital encounter of 09/17/23 (from the past week)  Urinalysis, Routine w reflex  microscopic -Urine, Clean Catch   Collection Time: 09/17/23  7:27 PM  Result Value Ref Range   Color, Urine AMBER (A) YELLOW   APPearance CLOUDY (A) CLEAR   Specific Gravity, Urine 1.032 (H) 1.005 - 1.030   pH 5.0 5.0 - 8.0   Glucose, UA NEGATIVE NEGATIVE mg/dL   Hgb urine dipstick NEGATIVE NEGATIVE   Bilirubin Urine NEGATIVE NEGATIVE   Ketones, ur 80 (A) NEGATIVE mg/dL   Protein, ur 696 (A) NEGATIVE mg/dL   Nitrite NEGATIVE NEGATIVE   Leukocytes,Ua MODERATE (A) NEGATIVE   RBC / HPF 0-5 0 - 5 RBC/hpf   WBC, UA 21-50 0 - 5 WBC/hpf   Bacteria, UA RARE (A) NONE SEEN   Squamous Epithelial / HPF >50 0 - 5 /HPF   Mucus PRESENT   Basic metabolic panel   Collection Time: 09/19/23  4:32 AM  Result Value Ref Range   Sodium 133 (L) 135 - 145 mmol/L   Potassium 3.5 3.5 - 5.1 mmol/L   Chloride 113 (H) 98 - 111 mmol/L   CO2 16 (L) 22 - 32 mmol/L   Glucose, Bld 74 70 - 99 mg/dL   BUN <5 (L) 6 - 20 mg/dL   Creatinine, Ser 2.95 0.44 - 1.00 mg/dL   Calcium  7.7 (L) 8.9 - 10.3 mg/dL   GFR, Estimated >28 >41 mL/min   Anion gap 4 (L) 5 - 15  Results for orders placed or performed during the hospital encounter of 09/14/23 (from the past week)  Urinalysis, Routine w reflex microscopic -Urine, Clean Catch   Collection Time: 09/14/23  3:59 PM  Result Value Ref Range   Color, Urine YELLOW YELLOW   APPearance CLOUDY (A) CLEAR   Specific Gravity, Urine 1.019 1.005 - 1.030   pH 7.0  5.0 - 8.0   Glucose, UA NEGATIVE NEGATIVE mg/dL   Hgb urine dipstick NEGATIVE NEGATIVE   Bilirubin Urine NEGATIVE NEGATIVE   Ketones, ur 80 (A) NEGATIVE mg/dL   Protein, ur NEGATIVE NEGATIVE mg/dL   Nitrite NEGATIVE NEGATIVE   Leukocytes,Ua NEGATIVE NEGATIVE  CBC   Collection Time: 09/14/23  4:40 PM  Result Value Ref Range   WBC 8.1 4.0 - 10.5 K/uL   RBC 3.19 (L) 3.87 - 5.11 MIL/uL   Hemoglobin 9.3 (L) 12.0 - 15.0 g/dL   HCT 65.7 (L) 84.6 - 96.2 %   MCV 88.1 80.0 - 100.0 fL   MCH 29.2 26.0 - 34.0 pg   MCHC 33.1  30.0 - 36.0 g/dL   RDW 95.2 84.1 - 32.4 %   Platelets 224 150 - 400 K/uL   nRBC 0.0 0.0 - 0.2 %  Comprehensive metabolic panel   Collection Time: 09/14/23  4:40 PM  Result Value Ref Range   Sodium 134 (L) 135 - 145 mmol/L   Potassium 3.7 3.5 - 5.1 mmol/L   Chloride 105 98 - 111 mmol/L   CO2 21 (L) 22 - 32 mmol/L   Glucose, Bld 72 70 - 99 mg/dL   BUN <5 (L) 6 - 20 mg/dL   Creatinine, Ser 4.01 0.44 - 1.00 mg/dL   Calcium  8.6 (L) 8.9 - 10.3 mg/dL   Total Protein 6.6 6.5 - 8.1 g/dL   Albumin 3.1 (L) 3.5 - 5.0 g/dL   AST 13 (L) 15 - 41 U/L   ALT 11 0 - 44 U/L   Alkaline Phosphatase 52 38 - 126 U/L   Total Bilirubin 0.6 0.0 - 1.2 mg/dL   GFR, Estimated >02 >72 mL/min   Anion gap 8 5 - 15  Magnesium   Collection Time: 09/14/23  4:40 PM  Result Value Ref Range   Magnesium 1.7 1.7 - 2.4 mg/dL   US  MFM OB FOLLOW UP Result Date: 08/28/2023 ----------------------------------------------------------------------  OBSTETRICS REPORT                       (Signed Final 08/28/2023 03:28 pm) ---------------------------------------------------------------------- Patient Info  ID #:       [id number]                          D.O.B.:  Nov 30, 2000 (23 yrs)(F)  Name:       [facility]                      Visit Date: 08/28/2023 01:07 pm ---------------------------------------------------------------------- Performed By  Attending:        Sal Crass MD         Ref. Address:     [facility]  Performed By:     Marybeth Smock         [facility]  Referred By:      Carles Cheadle                    CONSTANT MD ---------------------------------------------------------------------- Orders  #  Description                           Code        Ordered By  1  US  MFM OB FOLLOW UP                   95284.13     Penney Bowling ----------------------------------------------------------------------  #  Order #                     Accession #                Episode #  1  [id number]                   [id number]                 [id number] ---------------------------------------------------------------------- Indications  Obesity complicating pregnancy, second         O99.212  trimester (BMI 30)  Genetic carrier (Cysic Fibrosis carrier)       Z14.8  [redacted] weeks gestation of pregnancy                Z3A.24  Encounter for antenatal screening for          Z36.3  malformations  LR NIPS / Neg AFP ---------------------------------------------------------------------- Vital Signs  BP:          110/55 ---------------------------------------------------------------------- Fetal Evaluation  Num Of Fetuses:         1  Fetal Heart Rate(bpm):  140  Cardiac Activity:       Observed  Presentation:           Cephalic  Placenta:               Posterior  P. Cord Insertion:      Visualized  Amniotic Fluid  AFI FV:      Within normal limits  AFI Sum(cm)     %Tile       Largest Pocket(cm)  18.49           74          5.4  RUQ(cm)       RLQ(cm)       LUQ(cm)        LLQ(cm)  5.4           2.86          5.24           4.99 ---------------------------------------------------------------------- Biometry  BPD:      60.6  mm     G. Age:  24w 5d         68  %    CI:        75.18   %    70 - 86  FL/HC:      17.8   %    18.7 - 20.9  HC:      221.7  mm     G. Age:  24w 1d         39  %    HC/AC:      1.18        1.05 - 1.21  AC:      188.4  mm     G. Age:  23w 4d         29  %    FL/BPD:     65.2   %    71 - 87  FL:       39.5  mm     G. Age:  22w 5d          8  %    FL/AC:      21.0   %    20 - 24  LV:        5.5  mm  Est. FW:     589  gm      1 lb 5 oz     17  % ---------------------------------------------------------------------- OB History  Blood Type:   O+  Maternal Racial/Ethnic Group:   Black  (non-Hispanic)  Gravidity:    1         Term:   0        Prem:   0        SAB:   0  TOP:          0       Ectopic:  0        Living: 0 ---------------------------------------------------------------------- Gestational Age  LMP:           24w 0d        Date:  03/13/23                 EDD:   12/18/23  U/S Today:     23w 6d                                        EDD:   12/19/23  Best:          24w 0d     Det. By:  LMP  (03/13/23)          EDD:   12/18/23 ---------------------------------------------------------------------- Anatomy  Cranium:               Previously seen        Aortic Arch:            Previously seen  Cavum:                 Previously seen        Ductal Arch:            Previously seen  Ventricles:            Appears normal         Diaphragm:              Appears normal  Choroid Plexus:        Previously seen        Stomach:                Appears normal, left  sided  Cerebellum:            Previously seen        Abdomen:                Previously seen  Posterior Fossa:       Previously seen        Abdominal Wall:         Previously seen  Face:                  Orbits and profile     Cord Vessels:           Previously seen                         previously seen  Lips:                  Previously seen        Kidneys:                Appear normal  Thoracic:              Previously seen        Bladder:                Appears normal  Heart:                 Appears normal         Spine:                  Previously seen                         (4CH, axis, and                         situs)  RVOT:                  Appears normal         Upper Extremities:      Previously seen  LVOT:                  Appears normal         Lower Extremities:      Previously seen  Other:  4ch, rvot and crossing seen and appear normal ---------------------------------------------------------------------- Cervix Uterus Adnexa  Cervix  Not visualized (advanced GA  >24wks)  Uterus  Single fibroid noted, see table below.  Right Ovary  Size(cm)     2.95   x   1.96   x  1.82      Vol(ml): 5.51  Within normal limits.  Left Ovary  Size(cm)     4.24   x   1.6    x  2.36      Vol(ml): 8.38  Within normal limits.  Cul De Sac  No free fluid seen.  Adnexa  No abnormality visualized ---------------------------------------------------------------------- Myomas  Site                     L(cm)      W(cm)      D(cm)       Location  Mid anterior             2.94       1.92       3.25 ----------------------------------------------------------------------  Blood Flow  RI       PI       Comments ---------------------------------------------------------------------- Comments  Yoselyn Panuco is currently at 24 weeks and 0 days.  She was  seen as the views of the fetal anatomy were unable to be fully  visualized during her last exam.  She denies any problems  since her last exam.  The overall EFW obtained today of 1 pound 5 ounces  measures at the 17th percentile for her gestational age.  There was normal amniotic fluid noted.  The views of the fetal anatomy were visualized today.  There  were no obvious anomalies noted.  The limitations of ultrasound in the detection of all anomalies  was discussed.  As the views of the fetal anatomy were visualized today, no  further exams were scheduled in our office.  The patient stated that all of her questions were answered  today.  A total of 20 minutes was spent counseling and coordinating  the care for this patient.  Greater than 50% of the time was  spent in direct face-to-face contact. ----------------------------------------------------------------------                   Sal Crass, MD Electronically Signed Final Report   08/28/2023 03:28 pm ----------------------------------------------------------------------    Future Appointments  Date Time Provider Department Center  09/27/2023  9:05 AM [facility] None  09/27/2023  9:55 AM  Leftwich-Kirby, Darren Em, CNM [facility] None    Discharge Condition: Stable  Discharge disposition: 01-Home or Self Care       Discharge Instructions     Discharge activity:  No Restrictions   Complete by: As directed    Discharge diet:   Complete by: As directed    Bland diet if nauseated   No sexual activity restrictions   Complete by: As directed    Notify physician for a general feeling that "something is not right"   Complete by: As directed    Notify physician for increase or change in vaginal discharge   Complete by: As directed    Notify physician for intestinal cramps, with or without diarrhea, sometimes described as "gas pain"   Complete by: As directed    Notify physician for leaking of fluid   Complete by: As directed    Notify physician for low, dull backache, unrelieved by heat or Tylenol    Complete by: As directed    Notify physician for menstrual like cramps   Complete by: As directed    Notify physician for pelvic pressure   Complete by: As directed    Notify physician for uterine contractions.  These may be painless and feel like the uterus is tightening or the baby is  "balling up"   Complete by: As directed    Notify physician for vaginal bleeding   Complete by: As directed    PRETERM LABOR:  Includes any of the follwing symptoms that occur between 20 - [redacted] weeks gestation.  If these symptoms are not stopped, preterm labor can result in preterm delivery, placing your baby at risk   Complete by: As directed       Allergies as of 09/19/2023   No Known Allergies      Medication List     STOP taking these medications    famotidine  20 MG tablet Commonly known as: Pepcid        TAKE these medications    ferrous sulfate  325 (65 FE) MG tablet Take 1 tablet (325 mg total) by mouth  daily.   ondansetron  8 MG disintegrating tablet Commonly known as: ZOFRAN -ODT Take 1 tablet (8 mg total) by mouth every 8 (eight) hours as needed for nausea or  vomiting. What changed:  medication strength how much to take   pantoprazole  40 MG tablet Commonly known as: Protonix  Take 1 tablet (40 mg total) by mouth daily.   prenatal multivitamin Tabs tablet Take 1 tablet by mouth daily at 12 noon.   promethazine  12.5 MG tablet Commonly known as: PHENERGAN  Take 1 tablet (12.5 mg total) by mouth every 6 (six) hours as needed for nausea or vomiting.   scopolamine  1 MG/3DAYS Commonly known as: TRANSDERM-SCOP Place 1 patch (1.5 mg total) onto the skin once for 1 dose.        Follow-up Information     [facility] [place]. Go on 09/27/2023.   Specialty: Obstetrics and Gynecology Why: previously scheduled appointment Contact information: [address] [place]  [address] [phone]                Total discharge time: 20 minutes   Signed: Abigail Abler M.D. 09/19/2023, 11:38 AM

## 2023-09-27 ENCOUNTER — Other Ambulatory Visit

## 2023-09-27 ENCOUNTER — Ambulatory Visit (INDEPENDENT_AMBULATORY_CARE_PROVIDER_SITE_OTHER): Payer: Self-pay | Admitting: Advanced Practice Midwife

## 2023-09-27 ENCOUNTER — Encounter: Payer: Self-pay | Admitting: Advanced Practice Midwife

## 2023-09-27 ENCOUNTER — Telehealth: Payer: Self-pay | Admitting: Licensed Clinical Social Worker

## 2023-09-27 VITALS — BP 106/69 | HR 89 | Wt 176.4 lb

## 2023-09-27 DIAGNOSIS — Z1331 Encounter for screening for depression: Secondary | ICD-10-CM | POA: Diagnosis not present

## 2023-09-27 DIAGNOSIS — O211 Hyperemesis gravidarum with metabolic disturbance: Secondary | ICD-10-CM | POA: Diagnosis not present

## 2023-09-27 DIAGNOSIS — Z3A28 28 weeks gestation of pregnancy: Secondary | ICD-10-CM | POA: Diagnosis not present

## 2023-09-27 DIAGNOSIS — Z23 Encounter for immunization: Secondary | ICD-10-CM

## 2023-09-27 DIAGNOSIS — Z3400 Encounter for supervision of normal first pregnancy, unspecified trimester: Secondary | ICD-10-CM

## 2023-09-27 NOTE — Progress Notes (Signed)
 Was in MAU for 3-4 days two weeks ago due to nausea and vomiting. Has scopolamine  patch behind ear.

## 2023-09-27 NOTE — Telephone Encounter (Signed)
 [facility] contacted patient to schedule a BH appointment. Appt scheduled for 09/28/23

## 2023-09-27 NOTE — Progress Notes (Signed)
   PRENATAL VISIT NOTE  Subjective:  Selena Dawson is a 23 y.o. G1P0 at [redacted]w[redacted]d being seen today for ongoing prenatal care.  She is currently monitored for the following issues for this low-risk pregnancy and has Encounter for supervision of normal first pregnancy in first trimester; Cystic fibrosis carrier, antepartum; ASCUS with positive high risk HPV cervical; Uterine fibroids affecting pregnancy in second trimester; and Hyperemesis affecting pregnancy, antepartum on their problem list.  Patient reports no complaints.  Contractions: Irritability. Vag. Bleeding: None.  Movement: Increased. Denies leaking of fluid.   The following portions of the patient's history were reviewed and updated as appropriate: allergies, current medications, past family history, past medical history, past social history, past surgical history and problem list.   Objective:    Vitals:   09/27/23 1003  BP: 106/69  Pulse: 89  Weight: 176 lb 6.4 oz (80 kg)    Fetal Status:  Fetal Heart Rate (bpm): 134 Fundal Height: 28 cm Movement: Increased    General: Alert, oriented and cooperative. Patient is in no acute distress.  Skin: Skin is warm and dry. No rash noted.   Cardiovascular: Normal heart rate noted  Respiratory: Normal respiratory effort, no problems with respiration noted  Abdomen: Soft, gravid, appropriate for gestational age.  Pain/Pressure: Absent     Pelvic: Cervical exam deferred        Extremities: Normal range of motion.  Edema: None  Mental Status: Normal mood and affect. Normal behavior. Normal judgment and thought content.   Assessment and Plan:  Pregnancy: G1P0 at [redacted]w[redacted]d 1. Encounter for supervision of normal first pregnancy  --Anticipatory guidance about next visits/weeks of pregnancy given.    2. Hyperemesis gravidarum with dehydration --Hospital admission 5/19 --Pt doing much better with meds, wearing scopolamine  patch today.  --Tolerating glucose for GTT well today  3. [redacted] weeks  gestation of pregnancy  - HIV antibody (with reflex) - RPR - CBC - Glucose Tolerance, 2 Hours w/1 Hour  4. Positive screening for depression on 9-item Patient Health Questionnaire (PHQ-9)  - Amb ref to Integrated Behavioral Health   Preterm labor symptoms and general obstetric precautions including but not limited to vaginal bleeding, contractions, leaking of fluid and fetal movement were reviewed in detail with the patient. Please refer to After Visit Summary for other counseling recommendations.   Return in about 4 weeks (around 10/25/2023) for LOB.  Future Appointments  Date Time Provider Department Center  09/28/2023  2:15 PM Alfrieda Antes [facility] None  10/11/2023  9:35 AM Tresia Fruit, MD [facility] None     Arlester Bence, CNM

## 2023-09-28 ENCOUNTER — Ambulatory Visit: Payer: PRIVATE HEALTH INSURANCE | Admitting: Licensed Clinical Social Worker

## 2023-09-28 DIAGNOSIS — F32A Depression, unspecified: Secondary | ICD-10-CM

## 2023-09-28 LAB — CBC
Hematocrit: 29.8 % — ABNORMAL LOW (ref 34.0–46.6)
Hemoglobin: 9.3 g/dL — ABNORMAL LOW (ref 11.1–15.9)
MCH: 28.5 pg (ref 26.6–33.0)
MCHC: 31.2 g/dL — ABNORMAL LOW (ref 31.5–35.7)
MCV: 91 fL (ref 79–97)
Platelets: 264 10*3/uL (ref 150–450)
RBC: 3.26 x10E6/uL — ABNORMAL LOW (ref 3.77–5.28)
RDW: 12.1 % (ref 11.7–15.4)
WBC: 6.4 10*3/uL (ref 3.4–10.8)

## 2023-09-28 LAB — GLUCOSE TOLERANCE, 2 HOURS W/ 1HR
Glucose, 1 hour: 62 mg/dL — ABNORMAL LOW (ref 70–179)
Glucose, 2 hour: 62 mg/dL — ABNORMAL LOW (ref 70–152)
Glucose, Fasting: 78 mg/dL (ref 70–91)

## 2023-09-28 LAB — HIV ANTIBODY (ROUTINE TESTING W REFLEX): HIV Screen 4th Generation wRfx: NONREACTIVE

## 2023-09-28 LAB — RPR: RPR Ser Ql: NONREACTIVE

## 2023-09-28 NOTE — BH Specialist Note (Signed)
 Integrated Behavioral Health via Telemedicine Visit   Selena Dawson [id number]  Number of Integrated Behavioral Health Clinician visits: 1- Initial Visit  Session Start time: 1415   Session End time: 1500  Total time in minutes: 45   Patient is participating in a Managed Medicaid Plan:  Yes  Referring Provider: Donetta Furl, CNM Patient/Family location: At [facility] Provider location: Remote Office All persons participating in visit: Patient and [facility] Types of Service: Individual psychotherapy and Video visit  I connected with Mareena Bollen and/or Jaylon Flagg's patient via  Telephone or Video Enabled Telemedicine Application  (Video is Caregility application) and verified that I am speaking with the correct person using two identifiers. Discussed confidentiality: Yes   I discussed the limitations of telemedicine and the availability of in person appointments.  Discussed there is a possibility of technology failure and discussed alternative modes of communication if that failure occurs.  I discussed that engaging in this telemedicine visit, they consent to the provision of behavioral healthcare and the services will be billed under their insurance.  Patient and/or legal guardian expressed understanding and consented to Telemedicine visit: Yes   Presenting Concerns: Patient and/or family reports the following symptoms/concerns: increased anxiety Duration of problem: Months; Severity of problem: moderate  Patient and/or Family's Strengths/Protective Factors: Concrete supports in place (healthy food, safe environments, etc.), Sense of purpose, and Caregiver has knowledge of parenting & child development  Goals Addressed: Patient will:  Reduce symptoms of: anxiety and stress   Increase knowledge and/or ability of: coping skills, healthy habits, and stress reduction   Demonstrate ability to: Increase healthy adjustment to current life circumstances and Increase adequate support systems for  patient/family  Progress towards Goals: Achieved  Interventions: Interventions utilized:  Supportive Counseling, Psychoeducation and/or Health Education, and Supportive Reflection Standardized Assessments completed: Not Needed    Patient and/or Family Response: Patient was present for today's virtual session and engaged appropriately. She reported previously residing in [place], where she was working and attending [employer]. Patient described a history of depression with intermittent episodes and consistent anxiety. She shared that she returned to her hometown approximately one year ago following the end of a significant relationship. Patient is currently unemployed and endorsed difficulty with self-care, noting that she is living with and receiving support from her parents. She expressed ongoing concerns related to her pregnancy, including changes in her body, fears about her baby's health, her ability to be a good mother, and a desire to avoid hardship for herself and her child. Patient stated she is taking prescribed medication, which she reports has been helpful in managing her symptoms. She also reported adequate nutritional intake, no current episodes of vomiting, and satisfactory sleep and rest.    Clinical Assessment/Diagnosis  Perinatal depression in third trimester    Assessment: Patient currently experiencing ongoing symptoms of depression and anxiety, heightened by recent life transitions, unemployment, and pregnancy-related stress. She reports difficulty with self-care and persistent worries about motherhood, her baby's health, and her ability to cope.   Patient may benefit from continued support from integrated behavioral health services.  Plan: Follow up with behavioral health clinician on : No follow up  Behavioral recommendations: Charlea to continue engaging in regular self-care routines, such as maintaining nutrition and rest, and incorporating grounding  techniques or mindfulness practices to manage anxiety and depression. Continued adherence to prescribed medication and exploring therapeutic support for pregnancy-related stress and adjustment to life transitions are also advised. Develop short term and long term goals to  review during next session.  Referral(s): Integrated [facility] (In Clinic)  I discussed the assessment and treatment plan with the patient and/or parent/guardian. They were provided an opportunity to ask questions and all were answered. They agreed with the plan and demonstrated an understanding of the instructions.   They were advised to call back or seek an in-person evaluation if the symptoms worsen or if the condition fails to improve as anticipated.  Selena Dawson, LCSWA

## 2023-10-02 ENCOUNTER — Other Ambulatory Visit: Payer: Self-pay | Admitting: Advanced Practice Midwife

## 2023-10-02 ENCOUNTER — Ambulatory Visit: Payer: Self-pay | Admitting: Advanced Practice Midwife

## 2023-10-02 DIAGNOSIS — D509 Iron deficiency anemia, unspecified: Secondary | ICD-10-CM

## 2023-10-02 MED ORDER — ACCRUFER 30 MG PO CAPS: 1.0000 | ORAL_CAPSULE | Freq: Two times a day (BID) | ORAL | 3 refills | Status: DC

## 2023-10-03 ENCOUNTER — Other Ambulatory Visit (HOSPITAL_COMMUNITY): Payer: Self-pay

## 2023-10-03 ENCOUNTER — Other Ambulatory Visit: Payer: Self-pay

## 2023-10-03 ENCOUNTER — Encounter (HOSPITAL_COMMUNITY): Payer: Self-pay

## 2023-10-03 DIAGNOSIS — Z3A27 27 weeks gestation of pregnancy: Secondary | ICD-10-CM

## 2023-10-03 DIAGNOSIS — O211 Hyperemesis gravidarum with metabolic disturbance: Secondary | ICD-10-CM

## 2023-10-03 MED ORDER — SCOPOLAMINE 1 MG/3DAYS TD PT72
1.0000 | MEDICATED_PATCH | TRANSDERMAL | 3 refills | Status: DC
  Filled 2023-10-03 (×2): qty 10, 30d supply, fill #0

## 2023-10-03 MED ORDER — SCOPOLAMINE 1 MG/3DAYS TD PT72
1.0000 | MEDICATED_PATCH | TRANSDERMAL | 3 refills | Status: DC
Start: 2023-10-03 — End: 2024-01-19

## 2023-10-11 ENCOUNTER — Ambulatory Visit (INDEPENDENT_AMBULATORY_CARE_PROVIDER_SITE_OTHER): Admitting: Obstetrics & Gynecology

## 2023-10-11 VITALS — BP 103/64 | HR 101 | Wt 175.0 lb

## 2023-10-11 DIAGNOSIS — Z3401 Encounter for supervision of normal first pregnancy, first trimester: Secondary | ICD-10-CM

## 2023-10-11 DIAGNOSIS — D259 Leiomyoma of uterus, unspecified: Secondary | ICD-10-CM | POA: Diagnosis not present

## 2023-10-11 DIAGNOSIS — O3412 Maternal care for benign tumor of corpus uteri, second trimester: Secondary | ICD-10-CM | POA: Diagnosis not present

## 2023-10-11 DIAGNOSIS — Z3A30 30 weeks gestation of pregnancy: Secondary | ICD-10-CM | POA: Diagnosis not present

## 2023-10-11 NOTE — Progress Notes (Signed)
   PRENATAL VISIT NOTE  Subjective:  Selena Dawson is a 23 y.o. G1P0 at [redacted]w[redacted]d being seen today for ongoing prenatal care.  She is currently monitored for the following issues for this low-risk pregnancy and has Encounter for supervision of normal first pregnancy in first trimester; Cystic fibrosis carrier, antepartum; ASCUS with positive high risk HPV cervical; Uterine fibroids affecting pregnancy in second trimester; and Hyperemesis affecting pregnancy, antepartum on their problem list.  Patient reports no complaints.  Contractions: Irregular. Vag. Bleeding: None.  Movement: Present. Denies leaking of fluid.   The following portions of the patient's history were reviewed and updated as appropriate: allergies, current medications, past family history, past medical history, past social history, past surgical history and problem list.   Objective:    Vitals:   10/11/23 0943  BP: 103/64  Pulse: (!) 101  Weight: 175 lb (79.4 kg)    Fetal Status:  Fetal Heart Rate (bpm): 130 Fundal Height: 31 cm Movement: Present    General: Alert, oriented and cooperative. Patient is in no acute distress.  Skin: Skin is warm and dry. No rash noted.   Cardiovascular: Normal heart rate noted  Respiratory: Normal respiratory effort, no problems with respiration noted  Abdomen: Soft, gravid, appropriate for gestational age.  Pain/Pressure: Present     Pelvic: Cervical exam deferred        Extremities: Normal range of motion.     Mental Status: Normal mood and affect. Normal behavior. Normal judgment and thought content.   Assessment and Plan:  Pregnancy: G1P0 at [redacted]w[redacted]d 1. Encounter for supervision of normal first pregnancy in first trimester (Primary) Doing well   2. Uterine fibroids affecting pregnancy in second trimester   3. [redacted] weeks gestation of pregnancy S=d  Preterm labor symptoms and general obstetric precautions including but not limited to vaginal bleeding, contractions, leaking of fluid and  fetal movement were reviewed in detail with the patient. Please refer to After Visit Summary for other counseling recommendations.   Return in about 2 weeks (around 10/25/2023).  Future Appointments  Date Time Provider Department Center  10/25/2023  1:30 PM Zelma Hidden, FNP [facility] None    Onnie Bilis, MD

## 2023-10-13 ENCOUNTER — Other Ambulatory Visit: Payer: Self-pay

## 2023-10-13 DIAGNOSIS — Z3482 Encounter for supervision of other normal pregnancy, second trimester: Secondary | ICD-10-CM

## 2023-10-13 MED ORDER — PROMETHAZINE HCL 12.5 MG PO TABS
12.5000 mg | ORAL_TABLET | Freq: Four times a day (QID) | ORAL | 0 refills | Status: DC | PRN
Start: 2023-10-13 — End: 2023-11-06

## 2023-10-13 MED ORDER — PANTOPRAZOLE SODIUM 40 MG PO TBEC: 40.0000 mg | DELAYED_RELEASE_TABLET | Freq: Every day | ORAL | 0 refills | Status: DC

## 2023-10-14 ENCOUNTER — Other Ambulatory Visit: Payer: Self-pay | Admitting: Nurse Practitioner

## 2023-10-17 ENCOUNTER — Encounter: Payer: Self-pay | Admitting: Advanced Practice Midwife

## 2023-10-17 ENCOUNTER — Other Ambulatory Visit: Payer: Self-pay

## 2023-10-17 MED ORDER — LOPERAMIDE HCL 2 MG PO TABS: 2.0000 mg | ORAL_TABLET | Freq: Four times a day (QID) | ORAL | 0 refills | Status: DC | PRN

## 2023-10-17 NOTE — Progress Notes (Signed)
 Pt complaining of diarrhea. BRAT diet recommendations given along with clear liquids and a safe medication list to take during pregnancy. Pt request order for Imodium AD rather than picking it up OTC. Order sent per protocol.

## 2023-10-25 ENCOUNTER — Encounter: Payer: Self-pay | Admitting: Obstetrics and Gynecology

## 2023-10-25 ENCOUNTER — Ambulatory Visit (INDEPENDENT_AMBULATORY_CARE_PROVIDER_SITE_OTHER): Admitting: Obstetrics and Gynecology

## 2023-10-25 VITALS — BP 116/65 | HR 86 | Wt 177.0 lb

## 2023-10-25 DIAGNOSIS — Z3A32 32 weeks gestation of pregnancy: Secondary | ICD-10-CM | POA: Diagnosis not present

## 2023-10-25 DIAGNOSIS — O210 Mild hyperemesis gravidarum: Secondary | ICD-10-CM | POA: Diagnosis not present

## 2023-10-25 DIAGNOSIS — Z3401 Encounter for supervision of normal first pregnancy, first trimester: Secondary | ICD-10-CM | POA: Diagnosis not present

## 2023-10-25 NOTE — Progress Notes (Signed)
   PRENATAL VISIT NOTE  Subjective:  Selena Dawson is a 24 y.o. G1P0 at [redacted]w[redacted]d being seen today for ongoing prenatal care.  She is currently monitored for the following issues for this low-risk pregnancy and has Encounter for supervision of normal first pregnancy in first trimester; Cystic fibrosis carrier, antepartum; ASCUS with positive high risk HPV cervical; Uterine fibroids affecting pregnancy in second trimester; and Hyperemesis affecting pregnancy, antepartum on their problem list.  Patient reports occasional braxton hicks with lots of walking.  Contractions: Irritability. Vag. Bleeding: None.  Movement: Increased. Denies leaking of fluid.   The following portions of the patient's history were reviewed and updated as appropriate: allergies, current medications, past family history, past medical history, past social history, past surgical history and problem list.   Objective:    Vitals:   10/25/23 1330  BP: 116/65  Pulse: 86  Weight: 80.3 kg    Fetal Status:  Fetal Heart Rate (bpm): 142   Movement: Increased    General: Alert, oriented and cooperative. Patient is in no acute distress.  Skin: Skin is warm and dry. No rash noted.   Cardiovascular: Normal heart rate noted  Respiratory: Normal respiratory effort, no problems with respiration noted  Abdomen: Soft, gravid, appropriate for gestational age.  Pain/Pressure: Present     Pelvic: Cervical exam deferred        Extremities: Normal range of motion.  Edema: None  Mental Status: Normal mood and affect. Normal behavior. Normal judgment and thought content.   Assessment and Plan:  Pregnancy: G1P0 at [redacted]w[redacted]d 1. Encounter for supervision of normal first pregnancy in first trimester (Primary) -Good FM, BP WNL -Discussed birth control options, concerned about possible side effects but will let us  know -taking iron as prescribed  2. [redacted] weeks gestation of pregnancy   3. Hyperemesis affecting pregnancy, antepartum -well managed with  scopolamine  patch & phenergan    Preterm labor symptoms and general obstetric precautions including but not limited to vaginal bleeding, contractions, leaking of fluid and fetal movement were reviewed in detail with the patient. Please refer to After Visit Summary for other counseling recommendations.    Follow up in 2 weeks  Elenor Mole, SWHNP 10/25/23

## 2023-10-25 NOTE — Progress Notes (Signed)
Pt states everything is going well

## 2023-11-02 ENCOUNTER — Ambulatory Visit: Admitting: Licensed Clinical Social Worker

## 2023-11-02 NOTE — BH Specialist Note (Signed)
 [facility] provided virtual link to patient on this date. [facility] contacted patient and left a message. Patient no showed for today's visit.

## 2023-11-05 ENCOUNTER — Encounter (HOSPITAL_COMMUNITY): Payer: Self-pay | Admitting: Obstetrics & Gynecology

## 2023-11-05 ENCOUNTER — Inpatient Hospital Stay (HOSPITAL_COMMUNITY)

## 2023-11-05 ENCOUNTER — Inpatient Hospital Stay (HOSPITAL_COMMUNITY)
Admission: AD | Admit: 2023-11-05 | Discharge: 2023-11-05 | Disposition: A | Discharge status: Home / Self Care | Attending: Obstetrics & Gynecology | Admitting: Obstetrics & Gynecology

## 2023-11-05 DIAGNOSIS — W19XXXA Unspecified fall, initial encounter: Secondary | ICD-10-CM

## 2023-11-05 DIAGNOSIS — O368130 Decreased fetal movements, third trimester, not applicable or unspecified: Secondary | ICD-10-CM | POA: Insufficient documentation

## 2023-11-05 DIAGNOSIS — Z9181 History of falling: Secondary | ICD-10-CM | POA: Diagnosis present

## 2023-11-05 DIAGNOSIS — O26893 Other specified pregnancy related conditions, third trimester: Secondary | ICD-10-CM | POA: Diagnosis not present

## 2023-11-05 DIAGNOSIS — M533 Sacrococcygeal disorders, not elsewhere classified: Secondary | ICD-10-CM | POA: Diagnosis present

## 2023-11-05 DIAGNOSIS — W108XXA Fall (on) (from) other stairs and steps, initial encounter: Secondary | ICD-10-CM | POA: Diagnosis not present

## 2023-11-05 DIAGNOSIS — O9A213 Injury, poisoning and certain other consequences of external causes complicating pregnancy, third trimester: Secondary | ICD-10-CM

## 2023-11-05 DIAGNOSIS — Z3A33 33 weeks gestation of pregnancy: Secondary | ICD-10-CM

## 2023-11-05 DIAGNOSIS — S3991XA Unspecified injury of abdomen, initial encounter: Secondary | ICD-10-CM | POA: Diagnosis not present

## 2023-11-05 DIAGNOSIS — O4703 False labor before 37 completed weeks of gestation, third trimester: Secondary | ICD-10-CM | POA: Diagnosis not present

## 2023-11-05 DIAGNOSIS — W109XXA Fall (on) (from) unspecified stairs and steps, initial encounter: Secondary | ICD-10-CM | POA: Insufficient documentation

## 2023-11-05 DIAGNOSIS — R1010 Upper abdominal pain, unspecified: Secondary | ICD-10-CM | POA: Diagnosis present

## 2023-11-05 LAB — URINALYSIS, ROUTINE W REFLEX MICROSCOPIC
Bilirubin Urine: NEGATIVE
Glucose, UA: NEGATIVE mg/dL
Hgb urine dipstick: NEGATIVE
Ketones, ur: 5 mg/dL — AB
Nitrite: NEGATIVE
Protein, ur: NEGATIVE mg/dL
Specific Gravity, Urine: 1.014 (ref 1.005–1.030)
pH: 6 (ref 5.0–8.0)

## 2023-11-05 MED ORDER — ACETAMINOPHEN 500 MG PO TABS
1000.0000 mg | ORAL_TABLET | Freq: Four times a day (QID) | ORAL | Status: DC | PRN
  Administered 2023-11-05: 1000 mg via ORAL
  Filled 2023-11-05: qty 2

## 2023-11-05 MED ORDER — CYCLOBENZAPRINE HCL 10 MG PO TABS: 10.0000 mg | ORAL_TABLET | Freq: Two times a day (BID) | ORAL | 0 refills | Status: DC | PRN

## 2023-11-05 NOTE — Discharge Instructions (Signed)
 It was a pleasure taking care of you today.  I am sorry you fell.  I recommend you taking 1000 g of Tylenol  every 8 hours as needed for the pain and I also sent a prescription for Flexeril  to your pharmacy which is a muscle relaxer.  This will make you sleepy.  We did an ultrasound of your baby and monitored the heartbeat and both of those looked wonderful.  If you have any worsening symptoms or other concerns please return for further evaluation.  I hope you have a wonderful rest of your night!

## 2023-11-05 NOTE — Progress Notes (Unsigned)
   PRENATAL VISIT NOTE  Subjective:  Selena Dawson is a 23 y.o. G1P0 at [redacted]w[redacted]d being seen today for ongoing prenatal care.  She is currently monitored for the following issues for this low-risk pregnancy and has Encounter for supervision of normal first pregnancy in first trimester; Cystic fibrosis carrier, antepartum; ASCUS with positive high risk HPV cervical; Uterine fibroids affecting pregnancy in second trimester; and Hyperemesis affecting pregnancy, antepartum on their problem list.  Patient reports no complaints.  Contractions: Not present. Vag. Bleeding: None.  Movement: Increased. Denies leaking of fluid.   The following portions of the patient's history were reviewed and updated as appropriate: allergies, current medications, past family history, past medical history, past social history, past surgical history and problem list.   Objective:    Vitals:   11/08/23 1456  BP: 107/72  Pulse: 98  Weight: 175 lb 9.6 oz (79.7 kg)    Fetal Status:  Fetal Heart Rate (bpm): 134 Fundal Height: 32 cm Movement: Increased    General: Alert, oriented and cooperative. Patient is in no acute distress.  Skin: Skin is warm and dry. No rash noted.   Cardiovascular: Normal heart rate noted  Respiratory: Normal respiratory effort, no problems with respiration noted  Abdomen: Soft, gravid, appropriate for gestational age.  Pain/Pressure: Absent     Pelvic: Cervical exam deferred        Extremities: Normal range of motion.  Edema: None  Mental Status: Normal mood and affect. Normal behavior. Normal judgment and thought content.   Assessment and Plan:  Pregnancy: G1P0 at [redacted]w[redacted]d  1. Supervision of other normal pregnancy, antepartum (Primary) Patient doing well, feeling regular fetal movement BP, FHR, FH appropriate  2. [redacted] weeks gestation of pregnancy Anticipatory guidance regarding upcoming visit/weeks of pregnancy given  3. Uterine fibroids affecting pregnancy in third trimester 08/28/2023 EFW:  589 gm (17 %), AFI WNL  4. Anemia, unspecified type 09/27/2023 Hgb 9.3  Compliant on oral iron  Preterm labor symptoms and general obstetric precautions including but not limited to vaginal bleeding, contractions, leaking of fluid and fetal movement were reviewed in detail with the patient.  Please refer to After Visit Summary for other counseling recommendations.   Return in about 2 weeks (around 11/22/2023) for LOB+GBS.  Future Appointments  Date Time Provider Department Center  11/28/2023  2:30 PM Aveah Castell E, PA-C [facility] None    Alaysha Jefcoat E Renald Haithcock, PA-C

## 2023-11-05 NOTE — MAU Provider Note (Signed)
 History     CSN: [id number]  Arrival date and time: 11/05/23 2023   None     Chief Complaint  Patient presents with   Abdominal Pain   Back Pain   Fall   HPI Selena Dawson is a G1 at 33 weeks 6 days presenting after a fall.  Reports she was walking and slipped on a wet floor and landed directly on her bottom.  She reports that immediately after she noticed a lot of fetal movement but has not felt 5 movements in an hour or 10 in the last 2 hours.  Denies any bleeding or leaking of fluid.  Reports a lot of of pelvic pressure and she is not sure why that is occurring.  Denies any other symptoms.  OB History     Gravida  1   Para      Term      Preterm      AB      Living         SAB      IAB      Ectopic      Multiple      Live Births              Past Medical History:  Diagnosis Date   Medical history non-contributory     Past Surgical History:  Procedure Laterality Date   NO PAST SURGERIES      Family History  Problem Relation Age of Onset   Diabetes Mother     Social History   Tobacco Use   Smoking status: Never   Smokeless tobacco: Never  Vaping Use   Vaping status: Former   Substances: Nicotine, Flavoring  Substance Use Topics   Alcohol use: Not Currently    Comment: occasionally   Drug use: Not Currently    Types: Marijuana    Comment: stopped earlier in pregnancy    Allergies: No Known Allergies  Medications Prior to Admission  Medication Sig Dispense Refill Last Dose/Taking   Ferric Maltol  (ACCRUFER ) 30 MG CAPS Take 1 capsule (30 mg total) by mouth 2 (two) times daily at 8 am and 10 pm. 60 capsule 3 11/05/2023 Morning   pantoprazole  (PROTONIX ) 40 MG tablet Take 1 tablet (40 mg total) by mouth daily. 30 tablet 0 11/05/2023 Morning   Prenatal Vit-Fe Fumarate-FA (PRENATAL MULTIVITAMIN) TABS tablet Take 1 tablet by mouth daily at 12 noon.   11/05/2023 Morning   promethazine  (PHENERGAN ) 12.5 MG tablet Take 1 tablet (12.5 mg total) by mouth  every 6 (six) hours as needed for nausea or vomiting. 30 tablet 0 11/05/2023 Morning   scopolamine  (TRANSDERM-SCOP) 1 MG/3DAYS Place 1 patch (1.5 mg total) onto the skin every 3 (three) days. 10 patch 3 11/04/2023 Morning   loperamide  (IMODIUM  A-D) 2 MG tablet Take 1 tablet (2 mg total) by mouth 4 (four) times daily as needed for diarrhea or loose stools. 30 tablet 0    ondansetron  (ZOFRAN -ODT) 8 MG disintegrating tablet Take 1 tablet (8 mg total) by mouth every 8 (eight) hours as needed for nausea or vomiting. 30 tablet 0     Review of Systems  HENT:  Negative for postnasal drip.   Gastrointestinal:  Negative for abdominal pain.  Genitourinary:  Positive for vaginal pain.   Physical Exam   Blood pressure 109/86, pulse 96, temperature 98.6 F (37 C), temperature source Oral, resp. rate 20, height 5' 3 (1.6 m), weight 80.1 kg, last menstrual period 03/13/2023, SpO2 98%.  Physical Exam Vitals and nursing note reviewed.  Constitutional:      Appearance: Normal appearance.  HENT:     Head: Normocephalic and atraumatic.     Nose: No congestion or rhinorrhea.  Eyes:     Extraocular Movements: Extraocular movements intact.  Cardiovascular:     Rate and Rhythm: Normal rate.  Pulmonary:     Effort: Pulmonary effort is normal.  Abdominal:     Palpations: Abdomen is soft.     Tenderness: There is no abdominal tenderness.  Genitourinary:    Comments: Closed/thick/-2 Musculoskeletal:        General: Normal range of motion.     Cervical back: Normal range of motion.  Skin:    General: Skin is warm.     Capillary Refill: Capillary refill takes less than 2 seconds.  Neurological:     General: No focal deficit present.     Mental Status: She is alert.     Cranial Nerves: No cranial nerve deficit.  Psychiatric:        Mood and Affect: Mood normal.        Behavior: Behavior normal.     MAU Course  Procedures  MDM NST BPP SVE   Assessment and Plan  Selena Dawson is a 23 yo G1 at 33  weeks 6 days presenting after a fall.  Fall Patient fell and landed directly on her bottom.  Reports decreased fetal movement after the fall.  She has also been experiencing lower pelvic pressure.  SVE showing closed/thick/-2.  Reports that she did not feel 5 movements in the last hour or 10 in the last 2 hours.  BPP performed 8/8.  NST was reactive with accelerations, no D cells.  Patient was having contractions.  Patient given Tylenol  for the pain in her buttocks and oral hydration encouraged.  On reevaluation pain was improving but patient did report she would like something to help while she is laying down.  Prescription sent to patient's pharmacy for Flexeril .  She reports after the hydration that her contractions did slow down.  Encouraged adequate hydration.  Patient discharged home with return precautions  [name] 11/05/2023, 9:12 PM

## 2023-11-05 NOTE — MAU Note (Signed)
 MAU Triage Note:  .Selena Dawson is a 23 y.o. at [redacted]w[redacted]d here in MAU reporting: went to a baby shower and fell down some stairs around 1700. She landing on her buttocks and denies hitting her stomach. She tried to go home and take a nap, but then the pain started. She is feeling tightness in her upper abdomen and soreness in her buttocks. Denies VB or LOF. Reports DFM since the fall.   Patient complaint: fall, back and abd pain  Pain Score: 7  Pain Location: Abdomen Pain Score: 7 Pain Location: Buttocks   Onset of complaint: today LMP: Patient's last menstrual period was 03/13/2023 (exact date).  Vitals:   11/05/23 2035  BP: 109/86  Pulse: 96  Resp: 20  Temp: 98.6 F (37 C)  SpO2: 98%    FHT:  Fetal Heart Rate Mode: Doppler Baseline Rate (A): 154 bpm Lab orders placed from triage: UA

## 2023-11-06 ENCOUNTER — Other Ambulatory Visit: Payer: Self-pay

## 2023-11-06 MED ORDER — PROMETHAZINE HCL 12.5 MG PO TABS: 12.5000 mg | ORAL_TABLET | Freq: Four times a day (QID) | ORAL | 0 refills | Status: DC | PRN

## 2023-11-08 ENCOUNTER — Encounter: Payer: Self-pay | Admitting: Physician Assistant

## 2023-11-08 ENCOUNTER — Ambulatory Visit (INDEPENDENT_AMBULATORY_CARE_PROVIDER_SITE_OTHER): Admitting: Physician Assistant

## 2023-11-08 VITALS — BP 107/72 | HR 98 | Wt 175.6 lb

## 2023-11-08 DIAGNOSIS — O99013 Anemia complicating pregnancy, third trimester: Secondary | ICD-10-CM | POA: Diagnosis not present

## 2023-11-08 DIAGNOSIS — Z3A34 34 weeks gestation of pregnancy: Secondary | ICD-10-CM | POA: Diagnosis not present

## 2023-11-08 DIAGNOSIS — D259 Leiomyoma of uterus, unspecified: Secondary | ICD-10-CM

## 2023-11-08 DIAGNOSIS — O3413 Maternal care for benign tumor of corpus uteri, third trimester: Secondary | ICD-10-CM

## 2023-11-08 DIAGNOSIS — Z3480 Encounter for supervision of other normal pregnancy, unspecified trimester: Secondary | ICD-10-CM

## 2023-11-08 DIAGNOSIS — D649 Anemia, unspecified: Secondary | ICD-10-CM

## 2023-11-08 NOTE — Progress Notes (Signed)
 Pt had a fall this past Sunday, went to MAU, they had given her meds. Pt was told everything was okay. Was given US  and NST.

## 2023-11-09 ENCOUNTER — Other Ambulatory Visit: Payer: Self-pay | Admitting: Obstetrics

## 2023-11-09 DIAGNOSIS — Z3482 Encounter for supervision of other normal pregnancy, second trimester: Secondary | ICD-10-CM

## 2023-11-22 ENCOUNTER — Encounter: Admitting: Physician Assistant

## 2023-11-28 ENCOUNTER — Ambulatory Visit: Admitting: Physician Assistant

## 2023-11-28 ENCOUNTER — Other Ambulatory Visit (HOSPITAL_COMMUNITY)
Admission: RE | Admit: 2023-11-28 | Discharge: 2023-11-28 | Disposition: A | Discharge status: Home / Self Care | Source: Ambulatory Visit | Attending: Physician Assistant | Admitting: Physician Assistant

## 2023-11-28 VITALS — BP 124/73 | HR 117 | Wt 177.0 lb

## 2023-11-28 DIAGNOSIS — Z3403 Encounter for supervision of normal first pregnancy, third trimester: Secondary | ICD-10-CM | POA: Insufficient documentation

## 2023-11-28 DIAGNOSIS — O99013 Anemia complicating pregnancy, third trimester: Secondary | ICD-10-CM

## 2023-11-28 DIAGNOSIS — Z3400 Encounter for supervision of normal first pregnancy, unspecified trimester: Secondary | ICD-10-CM

## 2023-11-28 DIAGNOSIS — Z3A37 37 weeks gestation of pregnancy: Secondary | ICD-10-CM | POA: Insufficient documentation

## 2023-11-28 DIAGNOSIS — O99019 Anemia complicating pregnancy, unspecified trimester: Secondary | ICD-10-CM | POA: Insufficient documentation

## 2023-11-28 DIAGNOSIS — D649 Anemia, unspecified: Secondary | ICD-10-CM

## 2023-11-28 NOTE — Progress Notes (Signed)
 Denies concerns today. Wants cx check.

## 2023-11-28 NOTE — Progress Notes (Signed)
   PRENATAL VISIT NOTE  Subjective:  Selena Dawson is a 23 y.o. G1P0 at [redacted]w[redacted]d being seen today for ongoing prenatal care.  She is currently monitored for the following issues for this low-risk pregnancy and has Encounter for supervision of normal first pregnancy in first trimester; Cystic fibrosis carrier, antepartum; ASCUS with positive high risk HPV cervical; Uterine fibroids affecting pregnancy in second trimester; Hyperemesis affecting pregnancy, antepartum; and Anemia affecting pregnancy on their problem list.  Patient reports no complaints.  Contractions: Irregular. Vag. Bleeding: None.  Movement: Present. Denies leaking of fluid.   The following portions of the patient's history were reviewed and updated as appropriate: allergies, current medications, past family history, past medical history, past social history, past surgical history and problem list.   Objective:    Vitals:   11/28/23 1445  BP: 124/73  Pulse: (!) 117  Weight: 177 lb (80.3 kg)    Fetal Status:  Fetal Heart Rate (bpm): 136 Fundal Height: 36 cm Movement: Present    General: Alert, oriented and cooperative. Patient is in no acute distress.  Skin: Skin is warm and dry. No rash noted.   Cardiovascular: Normal heart rate noted  Respiratory: Normal respiratory effort, no problems with respiration noted  Abdomen: Soft, gravid, appropriate for gestational age.  Pain/Pressure: Absent     Pelvic: Cervical dilation : 1 cm        Extremities: Normal range of motion.  Edema: None  Mental Status: Normal mood and affect. Normal behavior. Normal judgment and thought content.   Assessment and Plan:  Pregnancy: G1P0 at [redacted]w[redacted]d  1. Supervision of normal first pregnancy, antepartum (Primary) Patient doing well, feeling regular fetal movement BP, FHR, FH appropriate  2. [redacted] weeks gestation of pregnancy Anticipatory guidance about next visits/weeks of pregnancy given.   3. Anemia affecting pregnancy in third trimester 09/27/23  Hgb 9.3 Compliant on oral iron   Term labor symptoms and general obstetric precautions including but not limited to vaginal bleeding, contractions, leaking of fluid and fetal movement were reviewed in detail with the patient.  Please refer to After Visit Summary for other counseling recommendations.   Return in about 1 week (around 12/05/2023) for LOB.  Future Appointments  Date Time Provider Department Center  12/07/2023  2:50 PM Rudy Carlin LABOR, MD [facility] None    Jorene FORBES Moats, PA-C

## 2023-11-29 LAB — CERVICOVAGINAL ANCILLARY ONLY
Bacterial Vaginitis (gardnerella): POSITIVE — AB
Candida Glabrata: NEGATIVE
Candida Vaginitis: NEGATIVE
Chlamydia: NEGATIVE
Comment: NEGATIVE
Comment: NEGATIVE
Comment: NEGATIVE
Comment: NEGATIVE
Comment: NEGATIVE
Comment: NORMAL
Neisseria Gonorrhea: NEGATIVE
Trichomonas: NEGATIVE

## 2023-11-30 ENCOUNTER — Other Ambulatory Visit: Payer: Self-pay | Admitting: Obstetrics and Gynecology

## 2023-12-01 ENCOUNTER — Encounter (HOSPITAL_COMMUNITY): Payer: Self-pay | Admitting: Obstetrics and Gynecology

## 2023-12-01 ENCOUNTER — Inpatient Hospital Stay (HOSPITAL_COMMUNITY)
Admission: AD | Admit: 2023-12-01 | Discharge: 2023-12-01 | Disposition: A | Discharge status: Home / Self Care | Attending: Obstetrics and Gynecology | Admitting: Obstetrics and Gynecology

## 2023-12-01 DIAGNOSIS — Z3A37 37 weeks gestation of pregnancy: Secondary | ICD-10-CM

## 2023-12-01 DIAGNOSIS — O471 False labor at or after 37 completed weeks of gestation: Secondary | ICD-10-CM

## 2023-12-01 DIAGNOSIS — O479 False labor, unspecified: Secondary | ICD-10-CM

## 2023-12-01 NOTE — MAU Provider Note (Signed)
  S: Ms. Selena Dawson is a 23 y.o. G1P0 at [redacted]w[redacted]d  who presents to MAU today complaining contractions q 1-3 minutes since 2000. She denies vaginal bleeding. She denies LOF. She reports normal fetal movement.    O: BP 117/73 (BP Location: Right Arm)   Pulse 93   Temp 97.7 F (36.5 C) (Oral)   Resp 17   Ht 5' 3 (1.6 m)   Wt 80.3 kg   LMP 03/13/2023 (Exact Date)   SpO2 96%   BMI 31.35 kg/m  GENERAL: Well-developed, well-nourished female in no acute distress.  HEAD: Normocephalic, atraumatic.  CHEST: Normal effort of breathing, regular heart rate ABDOMEN: Soft, nontender, gravid  Cervical exam:  Dilation: 1 Effacement (%): Thick Cervical Position: Middle Station: -3 Exam by:: Perla alvarez RN   Fetal Monitoring: Baseline: 145 Variability: moderate Accelerations: present Decelerations: absent Contractions: q1-60min   A: SIUP at [redacted]w[redacted]d  False labor  P: Discharge home with strict return precautions Continue prenatal care as scheduled.   Zalma Channing, MD 12/01/2023 10:18 PM

## 2023-12-01 NOTE — MAU Note (Signed)
 Selena Dawson is a 23 y.o. at [redacted]w[redacted]d here in MAU reporting:  Desires a water birth.  NO lof, no bleeding, +FM.   Pt reports having Ctxs apart. 8/10 ctxs pain. Lower abdominal. Ctxs started at 8pm tonight.   FHT: 141 Lab orders placed from triage: labor eval.   Today's Vitals   12/01/23 2205  BP: 117/73  Pulse: 93  Resp: 17  Temp: 97.7 F (36.5 C)  TempSrc: Oral  SpO2: 96%  Weight: 80.3 kg  Height: 5' 3 (1.6 m)  PainSc: 8    Body mass index is 31.35 kg/m.

## 2023-12-02 LAB — CULTURE, BETA STREP (GROUP B ONLY): Strep Gp B Culture: NEGATIVE

## 2023-12-03 ENCOUNTER — Ambulatory Visit: Payer: Self-pay | Admitting: Physician Assistant

## 2023-12-04 ENCOUNTER — Other Ambulatory Visit: Payer: Self-pay

## 2023-12-04 MED ORDER — METRONIDAZOLE 500 MG PO TABS: 500.0000 mg | ORAL_TABLET | Freq: Two times a day (BID) | ORAL | 0 refills | Status: DC

## 2023-12-07 ENCOUNTER — Ambulatory Visit: Admitting: Obstetrics

## 2023-12-07 ENCOUNTER — Encounter: Payer: Self-pay | Admitting: Obstetrics

## 2023-12-07 VITALS — BP 123/78 | HR 98 | Wt 182.0 lb

## 2023-12-07 DIAGNOSIS — Z141 Cystic fibrosis carrier: Secondary | ICD-10-CM

## 2023-12-07 DIAGNOSIS — R87610 Atypical squamous cells of undetermined significance on cytologic smear of cervix (ASC-US): Secondary | ICD-10-CM | POA: Diagnosis not present

## 2023-12-07 DIAGNOSIS — Z3A38 38 weeks gestation of pregnancy: Secondary | ICD-10-CM

## 2023-12-07 DIAGNOSIS — O99013 Anemia complicating pregnancy, third trimester: Secondary | ICD-10-CM

## 2023-12-07 DIAGNOSIS — O99019 Anemia complicating pregnancy, unspecified trimester: Secondary | ICD-10-CM

## 2023-12-07 DIAGNOSIS — O09893 Supervision of other high risk pregnancies, third trimester: Secondary | ICD-10-CM

## 2023-12-07 DIAGNOSIS — Z3401 Encounter for supervision of normal first pregnancy, first trimester: Secondary | ICD-10-CM

## 2023-12-07 DIAGNOSIS — O09899 Supervision of other high risk pregnancies, unspecified trimester: Secondary | ICD-10-CM

## 2023-12-07 DIAGNOSIS — R87810 Cervical high risk human papillomavirus (HPV) DNA test positive: Secondary | ICD-10-CM

## 2023-12-07 NOTE — Progress Notes (Signed)
 States hands and feet swelling especially at night. Some irreg contractions that come and go.

## 2023-12-07 NOTE — Progress Notes (Signed)
 Subjective:  Selena Dawson is a 23 y.o. G1P0 at [redacted]w[redacted]d being seen today for ongoing prenatal care.  She is currently monitored for the following issues for this low-risk pregnancy and has Encounter for supervision of normal first pregnancy in first trimester; Cystic fibrosis carrier, antepartum; ASCUS with positive high risk HPV cervical; Uterine fibroids affecting pregnancy in second trimester; Hyperemesis affecting pregnancy, antepartum; and Anemia affecting pregnancy on their problem list.  Patient reports heartburn.  Contractions: Irregular. Vag. Bleeding: None.  Movement: Present. Denies leaking of fluid.   The following portions of the patient's history were reviewed and updated as appropriate: allergies, current medications, past family history, past medical history, past social history, past surgical history and problem list. Problem list updated.  Objective:   Vitals:   12/07/23 1447  BP: 123/78  Pulse: 98  Weight: 182 lb (82.6 kg)    Fetal Status:     Movement: Present     General:  Alert, oriented and cooperative. Patient is in no acute distress.  Skin: Skin is warm and dry. No rash noted.   Cardiovascular: Normal heart rate noted  Respiratory: Normal respiratory effort, no problems with respiration noted  Abdomen: Soft, gravid, appropriate for gestational age. Pain/Pressure: Present     Pelvic:  Cervical exam deferred        Extremities: Normal range of motion.  Edema: Trace  Mental Status: Normal mood and affect. Normal behavior. Normal judgment and thought content.   Urinalysis:      Assessment and Plan:  Pregnancy: G1P0 at [redacted]w[redacted]d  1. Encounter for supervision of normal first pregnancy in first trimester (Primary)  2. Anemia affecting pregnancy, antepartum - taking Accrufer .  Clinically stable.  3. Cystic fibrosis carrier, antepartum  4. ASCUS with positive high risk HPV cervical - repeat pap 3-4 months postpartum    There are no diagnoses linked to this  encounter. Term labor symptoms and general obstetric precautions including but not limited to vaginal bleeding, contractions, leaking of fluid and fetal movement were reviewed in detail with the patient. Please refer to After Visit Summary for other counseling recommendations.   Return in about 1 week (around 12/14/2023) for ROB.   Rudy Carlin LABOR, MD 12/07/2023

## 2023-12-14 ENCOUNTER — Ambulatory Visit (INDEPENDENT_AMBULATORY_CARE_PROVIDER_SITE_OTHER): Admitting: Obstetrics and Gynecology

## 2023-12-14 VITALS — BP 120/65 | HR 110 | Wt 182.6 lb

## 2023-12-14 DIAGNOSIS — O99013 Anemia complicating pregnancy, third trimester: Secondary | ICD-10-CM | POA: Diagnosis not present

## 2023-12-14 DIAGNOSIS — Z3A39 39 weeks gestation of pregnancy: Secondary | ICD-10-CM

## 2023-12-14 DIAGNOSIS — Z3400 Encounter for supervision of normal first pregnancy, unspecified trimester: Secondary | ICD-10-CM

## 2023-12-14 NOTE — Progress Notes (Signed)
 Pt presents for ROB visit. Requesting cervical check

## 2023-12-14 NOTE — Patient Instructions (Signed)
  Try the [employer] at [email or web address].[email or web address] daily to improve baby's position and encourage the onset of labor.

## 2023-12-14 NOTE — Progress Notes (Signed)
   PRENATAL VISIT NOTE  Subjective:  Selena Dawson is a 23 y.o. G1P0 at [redacted]w[redacted]d being seen today for ongoing prenatal care.  She is currently monitored for the following issues for this low-risk pregnancy and has Encounter for supervision of normal first pregnancy in first trimester; Cystic fibrosis carrier, antepartum; ASCUS with positive high risk HPV cervical; Uterine fibroids affecting pregnancy in second trimester; Hyperemesis affecting pregnancy, antepartum; and Anemia affecting pregnancy on their problem list.  Patient reports contractions since last night .  Contractions: Irritability. Vag. Bleeding: None.  Movement: Present. Denies leaking of fluid.   The following portions of the patient's history were reviewed and updated as appropriate: allergies, current medications, past family history, past medical history, past social history, past surgical history and problem list.   Objective:    Vitals:   12/14/23 1435  BP: 120/65  Pulse: (!) 110  Weight: 182 lb 9.6 oz (82.8 kg)    Fetal Status:  Fetal Heart Rate (bpm): 135 Fundal Height: 37 cm Movement: Present Presentation: Vertex  General: Alert, oriented and cooperative. Patient is in no acute distress.  Skin: Skin is warm and dry. No rash noted.   Cardiovascular: Normal heart rate noted  Respiratory: Normal respiratory effort, no problems with respiration noted  Abdomen: Soft, gravid, appropriate for gestational age.  Pain/Pressure: Present     Pelvic: Cervical exam performed in the presence of a chaperone Dilation: 1 Effacement (%): Thick Station: Ballotable  Extremities: Normal range of motion.  Edema: Trace  Mental Status: Normal mood and affect. Normal behavior. Normal judgment and thought content.   Assessment and Plan:  Pregnancy: G1P0 at 108w3d 1. Supervision of normal first pregnancy, antepartum (Primary) BP and FHR normal Doing well, feeling regular movement  FH appropriate  2. [redacted] weeks gestation of  pregnancy Discussed membrane sweeping today. Reviewed data on membrane sweeping  Reviewed risk of cramping, contractions, bleeding and ROM. Answered patient questions and she agreed to proceed with procedure.   Labor precautions  Postplacental IUD Still deciding peds  3. Anemia affecting pregnancy in third trimester Oral iron   Term labor symptoms and general obstetric precautions including but not limited to vaginal bleeding, contractions, leaking of fluid and fetal movement were reviewed in detail with the patient. Please refer to After Visit Summary for other counseling recommendations.     Future Appointments  Date Time Provider Department Center  12/24/2023 12:00 AM [facility] SCHED ROOM [facility] None    Nidia Daring, FNP

## 2023-12-20 ENCOUNTER — Ambulatory Visit: Admitting: Obstetrics and Gynecology

## 2023-12-20 ENCOUNTER — Other Ambulatory Visit: Payer: Self-pay

## 2023-12-20 ENCOUNTER — Encounter (HOSPITAL_COMMUNITY): Payer: Self-pay | Admitting: Obstetrics & Gynecology

## 2023-12-20 ENCOUNTER — Inpatient Hospital Stay (HOSPITAL_COMMUNITY)
Admission: AD | Admit: 2023-12-20 | Discharge: 2023-12-23 | DRG: 788 | Disposition: A | Discharge status: Home / Self Care | Attending: Family Medicine | Admitting: Family Medicine

## 2023-12-20 VITALS — BP 108/67 | HR 78 | Wt 183.0 lb

## 2023-12-20 DIAGNOSIS — Z141 Cystic fibrosis carrier: Secondary | ICD-10-CM | POA: Diagnosis not present

## 2023-12-20 DIAGNOSIS — O09899 Supervision of other high risk pregnancies, unspecified trimester: Secondary | ICD-10-CM

## 2023-12-20 DIAGNOSIS — O210 Mild hyperemesis gravidarum: Secondary | ICD-10-CM | POA: Diagnosis present

## 2023-12-20 DIAGNOSIS — O9902 Anemia complicating childbirth: Secondary | ICD-10-CM | POA: Diagnosis present

## 2023-12-20 DIAGNOSIS — Z3490 Encounter for supervision of normal pregnancy, unspecified, unspecified trimester: Principal | ICD-10-CM | POA: Diagnosis present

## 2023-12-20 DIAGNOSIS — Z5941 Food insecurity: Secondary | ICD-10-CM

## 2023-12-20 DIAGNOSIS — Z3400 Encounter for supervision of normal first pregnancy, unspecified trimester: Secondary | ICD-10-CM

## 2023-12-20 DIAGNOSIS — O99013 Anemia complicating pregnancy, third trimester: Secondary | ICD-10-CM | POA: Diagnosis not present

## 2023-12-20 DIAGNOSIS — Z98891 History of uterine scar from previous surgery: Secondary | ICD-10-CM

## 2023-12-20 DIAGNOSIS — Z833 Family history of diabetes mellitus: Secondary | ICD-10-CM

## 2023-12-20 DIAGNOSIS — D252 Subserosal leiomyoma of uterus: Secondary | ICD-10-CM | POA: Diagnosis present

## 2023-12-20 DIAGNOSIS — Z3A40 40 weeks gestation of pregnancy: Secondary | ICD-10-CM | POA: Diagnosis not present

## 2023-12-20 DIAGNOSIS — O3413 Maternal care for benign tumor of corpus uteri, third trimester: Secondary | ICD-10-CM | POA: Diagnosis present

## 2023-12-20 DIAGNOSIS — R87610 Atypical squamous cells of undetermined significance on cytologic smear of cervix (ASC-US): Secondary | ICD-10-CM | POA: Diagnosis present

## 2023-12-20 DIAGNOSIS — O99019 Anemia complicating pregnancy, unspecified trimester: Secondary | ICD-10-CM | POA: Diagnosis present

## 2023-12-20 DIAGNOSIS — O34211 Maternal care for low transverse scar from previous cesarean delivery: Secondary | ICD-10-CM | POA: Diagnosis present

## 2023-12-20 DIAGNOSIS — O480 Post-term pregnancy: Secondary | ICD-10-CM | POA: Diagnosis present

## 2023-12-20 DIAGNOSIS — D259 Leiomyoma of uterus, unspecified: Secondary | ICD-10-CM | POA: Diagnosis present

## 2023-12-20 LAB — CBC
HCT: 27.9 % — ABNORMAL LOW (ref 36.0–46.0)
Hemoglobin: 9.2 g/dL — ABNORMAL LOW (ref 12.0–15.0)
MCH: 28.5 pg (ref 26.0–34.0)
MCHC: 33 g/dL (ref 30.0–36.0)
MCV: 86.4 fL (ref 80.0–100.0)
Platelets: 300 K/uL (ref 150–400)
RBC: 3.23 MIL/uL — ABNORMAL LOW (ref 3.87–5.11)
RDW: 13.6 % (ref 11.5–15.5)
WBC: 9.9 K/uL (ref 4.0–10.5)
nRBC: 0 % (ref 0.0–0.2)

## 2023-12-20 LAB — TYPE AND SCREEN
ABO/RH(D): O POS
Antibody Screen: NEGATIVE

## 2023-12-20 MED ORDER — OXYTOCIN-SODIUM CHLORIDE 30-0.9 UT/500ML-% IV SOLN: 2.5000 [IU]/h | INTRAVENOUS | Status: DC

## 2023-12-20 MED ORDER — LACTATED RINGERS IV SOLN: INTRAVENOUS | Status: DC

## 2023-12-20 MED ORDER — EPHEDRINE 5 MG/ML INJ: 10.0000 mg | INTRAVENOUS | Status: DC | PRN

## 2023-12-20 MED ORDER — MISOPROSTOL 25 MCG QUARTER TABLET: 25.0000 ug | ORAL_TABLET | Freq: Once | ORAL | Status: DC

## 2023-12-20 MED ORDER — LIDOCAINE HCL (PF) 1 % IJ SOLN: 30.0000 mL | INTRAMUSCULAR | Status: DC | PRN

## 2023-12-20 MED ORDER — OXYTOCIN BOLUS FROM INFUSION: 333.0000 mL | Freq: Once | INTRAVENOUS | Status: DC

## 2023-12-20 MED ORDER — FENTANYL-BUPIVACAINE-NACL 0.5-0.125-0.9 MG/250ML-% EP SOLN
12.0000 mL/h | EPIDURAL | Status: DC | PRN
  Administered 2023-12-21: 12 mL/h via EPIDURAL
  Filled 2023-12-20: qty 250

## 2023-12-20 MED ORDER — PHENYLEPHRINE 80 MCG/ML (10ML) SYRINGE FOR IV PUSH (FOR BLOOD PRESSURE SUPPORT)
80.0000 ug | PREFILLED_SYRINGE | INTRAVENOUS | Status: DC | PRN
  Filled 2023-12-20: qty 10

## 2023-12-20 MED ORDER — ACETAMINOPHEN 325 MG PO TABS: 650.0000 mg | ORAL_TABLET | ORAL | Status: DC | PRN

## 2023-12-20 MED ORDER — TERBUTALINE SULFATE 1 MG/ML IJ SOLN: 0.2500 mg | Freq: Once | INTRAMUSCULAR | Status: DC | PRN

## 2023-12-20 MED ORDER — MISOPROSTOL 50MCG HALF TABLET: 50.0000 ug | ORAL_TABLET | Freq: Once | ORAL | Status: DC

## 2023-12-20 MED ORDER — MISOPROSTOL 50MCG HALF TABLET
50.0000 ug | ORAL_TABLET | ORAL | Status: DC | PRN
  Administered 2023-12-20: 50 ug via BUCCAL
  Filled 2023-12-20: qty 1

## 2023-12-20 MED ORDER — ONDANSETRON HCL 4 MG/2ML IJ SOLN
4.0000 mg | Freq: Four times a day (QID) | INTRAMUSCULAR | Status: DC | PRN
  Administered 2023-12-21: 4 mg via INTRAVENOUS
  Filled 2023-12-20: qty 2

## 2023-12-20 MED ORDER — FENTANYL CITRATE (PF) 100 MCG/2ML IJ SOLN
100.0000 ug | INTRAMUSCULAR | Status: DC | PRN
  Administered 2023-12-20 – 2023-12-21 (×6): 100 ug via INTRAVENOUS
  Filled 2023-12-20 (×6): qty 2

## 2023-12-20 MED ORDER — SOD CITRATE-CITRIC ACID 500-334 MG/5ML PO SOLN: 30.0000 mL | ORAL | Status: DC | PRN

## 2023-12-20 MED ORDER — LACTATED RINGERS IV SOLN: 500.0000 mL | INTRAVENOUS | Status: DC | PRN

## 2023-12-20 MED ORDER — DIPHENHYDRAMINE HCL 50 MG/ML IJ SOLN
12.5000 mg | INTRAMUSCULAR | Status: DC | PRN
  Administered 2023-12-21 (×2): 12.5 mg via INTRAVENOUS
  Filled 2023-12-20 (×2): qty 1

## 2023-12-20 MED ORDER — PHENYLEPHRINE 80 MCG/ML (10ML) SYRINGE FOR IV PUSH (FOR BLOOD PRESSURE SUPPORT): 80.0000 ug | PREFILLED_SYRINGE | INTRAVENOUS | Status: DC | PRN

## 2023-12-20 MED ORDER — LACTATED RINGERS IV SOLN
500.0000 mL | Freq: Once | INTRAVENOUS | Status: AC
  Administered 2023-12-21: 500 mL via INTRAVENOUS

## 2023-12-20 NOTE — Progress Notes (Signed)
 Patient ID: Selena Dawson, female   DOB: 2001/03/10, 22 y.o.   MRN: [id number]  Ambulating in hallway; cervical foley still in place  BPs 121/67, other VSS FHR 120s, +accels, no decels Ctx irreg Cx deferred  IUP@40 .2wks IOL process FHR stable  -Will add in buccal cytotec  as tracing has been reactive and reassuring since her arrival -Plan for Pitocin  when foley dislodges -Waterbirth discussion still open  Selena Dawson CNM 12/20/2023 9:01 PM

## 2023-12-20 NOTE — Progress Notes (Signed)
   PRENATAL VISIT NOTE  Subjective:  Selena Dawson is a 23 y.o. G1P0 at [redacted]w[redacted]d being seen today for ongoing prenatal care.  She is currently monitored for the following issues for this low-risk pregnancy and has Encounter for supervision of normal first pregnancy in first trimester; Cystic fibrosis carrier, antepartum; ASCUS with positive high risk HPV cervical; Uterine fibroids affecting pregnancy in second trimester; Hyperemesis affecting pregnancy, antepartum; and Anemia affecting pregnancy on their problem list.  Patient reports no complaints.  Contractions: Irregular. Vag. Bleeding: None.  Movement: Present. Denies leaking of fluid.   The following portions of the patient's history were reviewed and updated as appropriate: allergies, current medications, past family history, past medical history, past social history, past surgical history and problem list.   Objective:   Vitals:   12/20/23 1431  BP: 108/67  Pulse: 78  Weight: 183 lb (83 kg)   Body mass index is 32.42 kg/m. Total weight gain: 14 lb (6.35 kg)   Fetal Status: Fetal Heart Rate (bpm): 145 Fundal Height: 39 cm Movement: Present     General:  Alert, oriented and cooperative. Patient is in no acute distress.  Skin: Skin is warm and dry. No rash noted.   Cardiovascular: Normal heart rate noted  Respiratory: Normal respiratory effort, no problems with respiration noted  Abdomen: Soft, gravid, appropriate for gestational age.  Pain/Pressure: Absent     Pelvic: Cervical exam performed in the presence of a chaperone Dilation: 1 Effacement (%): 30 Station: -3  Extremities: Normal range of motion.     Mental Status: Normal mood and affect. Normal behavior. Normal judgment and thought content.   NST with one minute deceleration to 90s. Recovery noted with moderate variability  Assessment and Plan:  Pregnancy: G1P0 at [redacted]w[redacted]d 1. Supervision of normal first pregnancy, antepartum (Primary) NST with deceleration To L&D for direct  admit for induction. L&D staff notified. - Fetal nonstress test  2. Anemia affecting pregnancy in third trimester Oral iron   3. [redacted] weeks gestation of pregnancy - Fetal nonstress test  Term labor symptoms and general obstetric precautions including but not limited to vaginal bleeding, contractions, leaking of fluid and fetal movement were reviewed in detail with the patient. Please refer to After Visit Summary for other counseling recommendations.   No follow-ups on file.  Future Appointments  Date Time Provider Department Center  12/24/2023 12:00 AM [facility] SCHED ROOM [facility] None    Rollo ONEIDA Bring, MD

## 2023-12-20 NOTE — H&P (Signed)
 LABOR AND DELIVERY ADMISSION HISTORY AND PHYSICAL NOTE  Selena Dawson is a 23 y.o. female G1P0 with IUP at [redacted]w[redacted]d presenting for IOL for decelerations on NST in office.   Patient reports the fetal movement as active. Patient reports uterine contraction activity as rare. Patient reports vaginal bleeding as none. Patient describes fluid per vagina as None.   Patient denies headache, vision changes, chest pain, shortness of breath, right upper quadrant pain, or LE edema.  She plans on breast feeding. Her contraception plan is: IUD post-placental.  Prenatal History/Complications: PNC at [facility] - Fibroid: mid anterior 3x2x3 cm - HG - ASCUS w/HPV pos - cystic fibrosis carrier  Sono:  @[redacted]w[redacted]d , CWD, normal anatomy, cephalic presentation, posterior placenta, 17%ile  Pregnancy complications:  Patient Active Problem List   Diagnosis Date Noted   Encounter for induction of labor 12/20/2023   Post-dates pregnancy 12/20/2023   Anemia affecting pregnancy 11/28/2023   Hyperemesis affecting pregnancy, antepartum 09/18/2023   Uterine fibroids affecting pregnancy in second trimester 07/31/2023   Cystic fibrosis carrier, antepartum 06/21/2023   ASCUS with positive high risk HPV cervical 06/21/2023   Encounter for supervision of normal first pregnancy in first trimester 06/12/2023    Past Medical History: Past Medical History:  Diagnosis Date   Medical history non-contributory     Past Surgical History: Past Surgical History:  Procedure Laterality Date   NO PAST SURGERIES      Obstetrical History: OB History     Gravida  1   Para      Term      Preterm      AB      Living         SAB      IAB      Ectopic      Multiple      Live Births              Social History: Social History   Socioeconomic History   Marital status: Single    Spouse name: Not on file   Number of children: Not on file   Years of education: Not on file   Highest education level: Not on  file  Occupational History   Not on file  Tobacco Use   Smoking status: Never   Smokeless tobacco: Never  Vaping Use   Vaping status: Former   Substances: Nicotine, Flavoring  Substance and Sexual Activity   Alcohol use: Not Currently    Comment: occasionally   Drug use: Not Currently    Types: Marijuana    Comment: stopped earlier in pregnancy   Sexual activity: Not Currently    Birth control/protection: None  Other Topics Concern   Not on file  Social History Narrative   Not on file   Social Drivers of Health   Financial Resource Strain: Not on file  Food Insecurity: Food Insecurity Present (12/20/2023)   Hunger Vital Sign    Worried About Running Out of Food in the Last Year: Sometimes true    Ran Out of Food in the Last Year: Never true  Transportation Needs: No Transportation Needs (12/20/2023)   PRAPARE - Administrator, Civil Service (Medical): No    Lack of Transportation (Non-Medical): No  Physical Activity: Not on file  Stress: Not on file  Social Connections: Not on file    Family History: Family History  Problem Relation Age of Onset   Diabetes Mother     Allergies: No Known Allergies  Medications  Prior to Admission  Medication Sig Dispense Refill Last Dose/Taking   Ferric Maltol  (ACCRUFER ) 30 MG CAPS Take 1 capsule (30 mg total) by mouth 2 (two) times daily at 8 am and 10 pm. 60 capsule 3 12/20/2023   loperamide  (IMODIUM  A-D) 2 MG tablet Take 1 tablet (2 mg total) by mouth 4 (four) times daily as needed for diarrhea or loose stools. 30 tablet 0 12/19/2023   ondansetron  (ZOFRAN -ODT) 8 MG disintegrating tablet Take 1 tablet (8 mg total) by mouth every 8 (eight) hours as needed for nausea or vomiting. 30 tablet 0 Past Month   pantoprazole  (PROTONIX ) 40 MG tablet TAKE 1 TABLET BY MOUTH EVERY DAY 30 tablet 5 12/20/2023   Prenatal Vit-Fe Fumarate-FA (PRENATAL MULTIVITAMIN) TABS tablet Take 1 tablet by mouth daily at 12 noon.   12/20/2023    promethazine  (PHENERGAN ) 12.5 MG tablet TAKE 1 TABLET BY MOUTH EVERY 6 HOURS AS NEEDED FOR NAUSEA OR VOMITING. 30 tablet 0 Past Month   scopolamine  (TRANSDERM-SCOP) 1 MG/3DAYS Place 1 patch (1.5 mg total) onto the skin every 3 (three) days. 10 patch 3 12/19/2023   metroNIDAZOLE  (FLAGYL ) 500 MG tablet Take 1 tablet (500 mg total) by mouth 2 (two) times daily. (Patient not taking: Reported on 12/14/2023) 14 tablet 0      Review of Systems  All systems reviewed and negative except as stated in HPI  Physical Exam BP 119/65   Pulse 92   Temp 98 F (36.7 C) (Oral)   Resp 16   LMP 03/13/2023 (Exact Date)   SpO2 99%   Physical Exam Constitutional:      General: She is not in acute distress.    Appearance: She is not ill-appearing.  Cardiovascular:     Rate and Rhythm: Normal rate.  Pulmonary:     Effort: Pulmonary effort is normal.  Abdominal:     Comments: Gravid  Musculoskeletal:        General: No swelling.  Skin:    General: Skin is warm and dry.  Neurological:     General: No focal deficit present.  Psychiatric:        Mood and Affect: Mood normal.   Presentation: cephalic by exam  Fetal monitoring: Baseline: 135 bpm, Variability: Good {> 6 bpm), Accelerations: Reactive, and Decelerations: Absent Uterine activity: Rare  Cervix: 1/thick/ballotable  Prenatal labs: ABO, Rh: --/--/PENDING (08/20 1621) Antibody: PENDING (08/20 1621) Rubella: 2.02 (02/10 1359) RPR: Non Reactive (05/28 0856)  HBsAg: Negative (02/10 1359)  HIV: Non Reactive (05/28 0856)  GC/Chlamydia:  Neisseria Gonorrhea  Date Value Ref Range Status  11/28/2023 Negative  Final   Chlamydia  Date Value Ref Range Status  11/28/2023 Negative  Final   GBS: Negative/-- (07/29 1654)   Prenatal Transfer Tool  Maternal Diabetes: No Genetic Screening: Normal Maternal Ultrasounds/Referrals: Normal (Incomplete anatomy) Fetal Ultrasounds or other Referrals:  None Maternal Substance Abuse:  No Significant  Maternal Medications: PO iron  Significant Maternal Lab Results: Group B Strep negative  Results for orders placed or performed during the hospital encounter of 12/20/23 (from the past 24 hours)  CBC   Collection Time: 12/20/23  4:21 PM  Result Value Ref Range   WBC 9.9 4.0 - 10.5 K/uL   RBC 3.23 (L) 3.87 - 5.11 MIL/uL   Hemoglobin 9.2 (L) 12.0 - 15.0 g/dL   HCT 72.0 (L) 63.9 - 53.9 %   MCV 86.4 80.0 - 100.0 fL   MCH 28.5 26.0 - 34.0 pg   MCHC 33.0 30.0 -  36.0 g/dL   RDW 86.3 88.4 - 84.4 %   Platelets 300 150 - 400 K/uL   nRBC 0.0 0.0 - 0.2 %  Type and screen   Collection Time: 12/20/23  4:21 PM  Result Value Ref Range   ABO/RH(D) PENDING    Antibody Screen PENDING    Sample Expiration      12/23/2023,2359 Performed at [facility], [address], [place], [place] [address]     Assessment: Selena Dawson is a 23 y.o. G1P0 at [redacted]w[redacted]d here for IOL for deceleration on NST.  #Labor: Discussed FB given deceleration on NST in office in lieu of cytotec  til we have longer monitoring. She is amenable. Given fentanyl  pre-procedure for patient request. Discussed R/B/A of FB placement, and verbal consent obtained. Placed Cook without difficulty and balloon filled with 60 cc of saline. Mom and babe tolerated well. #Pain: IV pain meds PRN, epidural upon request #FHT: Category I #GBS/ID: Negative #MOF: breast feeding #MOC: IUD PP  #Mid anterior fibroid: 3x2x3 cm #ASCUS w/HPV: Nees PP pap  Almarie CHRISTELLA Moats, MD [facility]  12/20/2023, 5:39 PM

## 2023-12-21 ENCOUNTER — Inpatient Hospital Stay (HOSPITAL_COMMUNITY): Admitting: Anesthesiology

## 2023-12-21 ENCOUNTER — Other Ambulatory Visit: Payer: Self-pay

## 2023-12-21 ENCOUNTER — Encounter (HOSPITAL_COMMUNITY): Payer: Self-pay | Admitting: Obstetrics and Gynecology

## 2023-12-21 ENCOUNTER — Encounter (HOSPITAL_COMMUNITY)
Admission: AD | Disposition: A | Discharge status: Home / Self Care | Payer: Self-pay | Source: Home / Self Care | Attending: Family Medicine

## 2023-12-21 DIAGNOSIS — Z3A40 40 weeks gestation of pregnancy: Secondary | ICD-10-CM

## 2023-12-21 DIAGNOSIS — O480 Post-term pregnancy: Secondary | ICD-10-CM

## 2023-12-21 DIAGNOSIS — Z98891 History of uterine scar from previous surgery: Secondary | ICD-10-CM

## 2023-12-21 LAB — RPR: RPR Ser Ql: NONREACTIVE

## 2023-12-21 LAB — CBC
HCT: 24.2 % — ABNORMAL LOW (ref 36.0–46.0)
Hemoglobin: 8 g/dL — ABNORMAL LOW (ref 12.0–15.0)
MCH: 29 pg (ref 26.0–34.0)
MCHC: 33.1 g/dL (ref 30.0–36.0)
MCV: 87.7 fL (ref 80.0–100.0)
Platelets: 250 K/uL (ref 150–400)
RBC: 2.76 MIL/uL — ABNORMAL LOW (ref 3.87–5.11)
RDW: 13.8 % (ref 11.5–15.5)
WBC: 18.6 K/uL — ABNORMAL HIGH (ref 4.0–10.5)
nRBC: 0 % (ref 0.0–0.2)

## 2023-12-21 LAB — CREATININE, SERUM
Creatinine, Ser: 0.59 mg/dL (ref 0.44–1.00)
GFR, Estimated: >60 mL/min (ref >60)

## 2023-12-21 SURGERY — Surgical Case
Anesthesia: Epidural

## 2023-12-21 MED ORDER — HYDROCODONE-ACETAMINOPHEN 5-325 MG PO TABS
1.0000 | ORAL_TABLET | ORAL | Status: DC | PRN
  Administered 2023-12-22: 1 via ORAL
  Administered 2023-12-23: 2 via ORAL
  Filled 2023-12-21 (×3): qty 1

## 2023-12-21 MED ORDER — OXYTOCIN-SODIUM CHLORIDE 30-0.9 UT/500ML-% IV SOLN: 1.0000 m[IU]/min | INTRAVENOUS | Status: DC

## 2023-12-21 MED ORDER — LIDOCAINE-EPINEPHRINE (PF) 2 %-1:200000 IJ SOLN
INTRAMUSCULAR | Status: DC | PRN
  Administered 2023-12-21: 5 mL via EPIDURAL
  Administered 2023-12-21: 10 mL via EPIDURAL

## 2023-12-21 MED ORDER — MEPERIDINE HCL 25 MG/ML IJ SOLN: 6.2500 mg | INTRAMUSCULAR | Status: DC | PRN

## 2023-12-21 MED ORDER — FENTANYL CITRATE (PF) 100 MCG/2ML IJ SOLN
INTRAMUSCULAR | Status: DC | PRN
  Administered 2023-12-21: 100 ug via EPIDURAL

## 2023-12-21 MED ORDER — MORPHINE SULFATE (PF) 0.5 MG/ML IJ SOLN
INTRAMUSCULAR | Status: AC
  Filled 2023-12-21: qty 10

## 2023-12-21 MED ORDER — ONDANSETRON HCL 4 MG/2ML IJ SOLN
INTRAMUSCULAR | Status: DC | PRN
  Administered 2023-12-21: 4 mg via INTRAVENOUS

## 2023-12-21 MED ORDER — SIMETHICONE 80 MG PO CHEW: 80.0000 mg | CHEWABLE_TABLET | ORAL | Status: DC | PRN

## 2023-12-21 MED ORDER — FENTANYL CITRATE (PF) 100 MCG/2ML IJ SOLN: 25.0000 ug | INTRAMUSCULAR | Status: DC | PRN

## 2023-12-21 MED ORDER — TERBUTALINE SULFATE 1 MG/ML IJ SOLN
0.2500 mg | Freq: Once | INTRAMUSCULAR | Status: AC | PRN
  Administered 2023-12-21: 0.25 mg via SUBCUTANEOUS
  Filled 2023-12-21: qty 1

## 2023-12-21 MED ORDER — ENOXAPARIN SODIUM 40 MG/0.4ML IJ SOSY
40.0000 mg | PREFILLED_SYRINGE | INTRAMUSCULAR | Status: DC
  Administered 2023-12-22 – 2023-12-23 (×2): 40 mg via SUBCUTANEOUS
  Filled 2023-12-21 (×2): qty 0.4

## 2023-12-21 MED ORDER — FENTANYL CITRATE (PF) 100 MCG/2ML IJ SOLN
INTRAMUSCULAR | Status: AC
Start: 2023-12-21 — End: 2023-12-21
  Filled 2023-12-21: qty 2

## 2023-12-21 MED ORDER — LIDOCAINE HCL (PF) 1 % IJ SOLN
INTRAMUSCULAR | Status: DC | PRN
  Administered 2023-12-21 (×2): 4 mL via EPIDURAL

## 2023-12-21 MED ORDER — ACETAMINOPHEN 500 MG PO TABS
1000.0000 mg | ORAL_TABLET | Freq: Four times a day (QID) | ORAL | Status: AC
  Administered 2023-12-21 – 2023-12-22 (×4): 1000 mg via ORAL
  Filled 2023-12-21 (×4): qty 2

## 2023-12-21 MED ORDER — KETOROLAC TROMETHAMINE 30 MG/ML IJ SOLN: 30.0000 mg | Freq: Four times a day (QID) | INTRAMUSCULAR | Status: DC | PRN

## 2023-12-21 MED ORDER — SIMETHICONE 80 MG PO CHEW
80.0000 mg | CHEWABLE_TABLET | Freq: Three times a day (TID) | ORAL | Status: DC
  Administered 2023-12-22 – 2023-12-23 (×3): 80 mg via ORAL
  Filled 2023-12-21 (×3): qty 1

## 2023-12-21 MED ORDER — DIPHENHYDRAMINE HCL 50 MG/ML IJ SOLN
12.5000 mg | INTRAMUSCULAR | Status: DC | PRN
  Administered 2023-12-22: 12.5 mg via INTRAVENOUS
  Filled 2023-12-21: qty 1

## 2023-12-21 MED ORDER — MENTHOL 3 MG MT LOZG: 1.0000 | LOZENGE | OROMUCOSAL | Status: DC | PRN

## 2023-12-21 MED ORDER — MIDAZOLAM HCL 2 MG/2ML IJ SOLN
INTRAMUSCULAR | Status: DC | PRN
Start: 2023-12-21 — End: 2023-12-21
  Administered 2023-12-21: 2 mg via INTRAVENOUS

## 2023-12-21 MED ORDER — LEVONORGESTREL 20 MCG/DAY IU IUD
INTRAUTERINE_SYSTEM | INTRAUTERINE | Status: AC
Start: 2023-12-21 — End: 2023-12-21
  Filled 2023-12-21: qty 1

## 2023-12-21 MED ORDER — SODIUM CHLORIDE 0.9% FLUSH: 3.0000 mL | INTRAVENOUS | Status: DC | PRN

## 2023-12-21 MED ORDER — ACETAMINOPHEN 10 MG/ML IV SOLN
INTRAVENOUS | Status: DC | PRN
  Administered 2023-12-21: 1000 mg via INTRAVENOUS

## 2023-12-21 MED ORDER — ONDANSETRON HCL 4 MG/2ML IJ SOLN: 4.0000 mg | Freq: Three times a day (TID) | INTRAMUSCULAR | Status: DC | PRN

## 2023-12-21 MED ORDER — LACTATED RINGERS IV SOLN: INTRAVENOUS | Status: DC

## 2023-12-21 MED ORDER — STERILE WATER FOR IRRIGATION IR SOLN
Status: DC | PRN
  Administered 2023-12-21: 1000 mL

## 2023-12-21 MED ORDER — COCONUT OIL OIL
1.0000 | TOPICAL_OIL | Status: DC | PRN
Start: 2023-12-21 — End: 2023-12-23

## 2023-12-21 MED ORDER — DIPHENHYDRAMINE HCL 25 MG PO CAPS: 25.0000 mg | ORAL_CAPSULE | Freq: Four times a day (QID) | ORAL | Status: DC | PRN

## 2023-12-21 MED ORDER — MIDAZOLAM HCL 2 MG/2ML IJ SOLN
INTRAMUSCULAR | Status: AC
  Filled 2023-12-21: qty 2

## 2023-12-21 MED ORDER — IBUPROFEN 600 MG PO TABS
600.0000 mg | ORAL_TABLET | Freq: Four times a day (QID) | ORAL | Status: DC
  Administered 2023-12-22 – 2023-12-23 (×4): 600 mg via ORAL
  Filled 2023-12-21 (×4): qty 1

## 2023-12-21 MED ORDER — NALOXONE HCL 4 MG/10ML IJ SOLN: 1.0000 ug/kg/h | INTRAVENOUS | Status: DC | PRN

## 2023-12-21 MED ORDER — SODIUM CHLORIDE 0.9 % IV SOLN
INTRAVENOUS | Status: DC | PRN
  Administered 2023-12-21: 500 mg via INTRAVENOUS

## 2023-12-21 MED ORDER — TERBUTALINE SULFATE 1 MG/ML IJ SOLN
INTRAMUSCULAR | Status: AC
  Filled 2023-12-21: qty 1

## 2023-12-21 MED ORDER — OXYTOCIN-SODIUM CHLORIDE 30-0.9 UT/500ML-% IV SOLN
INTRAVENOUS | Status: DC | PRN
  Administered 2023-12-21: 300 mL via INTRAVENOUS

## 2023-12-21 MED ORDER — MORPHINE SULFATE (PF) 0.5 MG/ML IJ SOLN
INTRAMUSCULAR | Status: DC | PRN
  Administered 2023-12-21: 3 mg via EPIDURAL

## 2023-12-21 MED ORDER — SODIUM CHLORIDE 0.9 % IR SOLN
Status: DC | PRN
  Administered 2023-12-21: 1

## 2023-12-21 MED ORDER — WITCH HAZEL-GLYCERIN EX PADS
1.0000 | MEDICATED_PAD | CUTANEOUS | Status: DC | PRN
Start: 2023-12-21 — End: 2023-12-23

## 2023-12-21 MED ORDER — FENTANYL CITRATE (PF) 100 MCG/2ML IJ SOLN
INTRAMUSCULAR | Status: DC | PRN
  Administered 2023-12-21: 100 ug via INTRAVENOUS

## 2023-12-21 MED ORDER — KETOROLAC TROMETHAMINE 30 MG/ML IJ SOLN
30.0000 mg | Freq: Four times a day (QID) | INTRAMUSCULAR | Status: AC
  Administered 2023-12-21 – 2023-12-22 (×4): 30 mg via INTRAVENOUS
  Filled 2023-12-21 (×4): qty 1

## 2023-12-21 MED ORDER — DIPHENHYDRAMINE HCL 25 MG PO CAPS: 25.0000 mg | ORAL_CAPSULE | ORAL | Status: DC | PRN

## 2023-12-21 MED ORDER — FENTANYL CITRATE (PF) 100 MCG/2ML IJ SOLN
INTRAMUSCULAR | Status: AC
  Filled 2023-12-21: qty 2

## 2023-12-21 MED ORDER — PHENYLEPHRINE 80 MCG/ML (10ML) SYRINGE FOR IV PUSH (FOR BLOOD PRESSURE SUPPORT)
PREFILLED_SYRINGE | INTRAVENOUS | Status: DC | PRN
  Administered 2023-12-21: 80 ug via INTRAVENOUS
  Administered 2023-12-21: 160 ug via INTRAVENOUS

## 2023-12-21 MED ORDER — DEXAMETHASONE SODIUM PHOSPHATE 10 MG/ML IJ SOLN
INTRAMUSCULAR | Status: DC | PRN
  Administered 2023-12-21: 10 mg via INTRAVENOUS

## 2023-12-21 MED ORDER — BUPIVACAINE HCL (PF) 0.25 % IJ SOLN: INTRAMUSCULAR | Status: DC | PRN

## 2023-12-21 MED ORDER — TERBUTALINE SULFATE 1 MG/ML IJ SOLN
0.2500 mg | Freq: Once | INTRAMUSCULAR | Status: AC | PRN
  Administered 2023-12-21: 0.25 mg via SUBCUTANEOUS

## 2023-12-21 MED ORDER — OXYTOCIN-SODIUM CHLORIDE 30-0.9 UT/500ML-% IV SOLN
1.0000 m[IU]/min | INTRAVENOUS | Status: DC
  Administered 2023-12-21: 2 m[IU]/min via INTRAVENOUS
  Filled 2023-12-21: qty 500

## 2023-12-21 MED ORDER — PRENATAL MULTIVITAMIN CH
1.0000 | ORAL_TABLET | Freq: Every day | ORAL | Status: DC
  Administered 2023-12-22 – 2023-12-23 (×2): 1 via ORAL
  Filled 2023-12-21 (×2): qty 1

## 2023-12-21 MED ORDER — DIBUCAINE (PERIANAL) 1 % EX OINT
1.0000 | TOPICAL_OINTMENT | CUTANEOUS | Status: DC | PRN
Start: 2023-12-21 — End: 2023-12-23

## 2023-12-21 MED ORDER — OXYTOCIN-SODIUM CHLORIDE 30-0.9 UT/500ML-% IV SOLN: 2.5000 [IU]/h | INTRAVENOUS | Status: AC

## 2023-12-21 MED ORDER — SENNOSIDES-DOCUSATE SODIUM 8.6-50 MG PO TABS
2.0000 | ORAL_TABLET | ORAL | Status: DC
  Administered 2023-12-22 – 2023-12-23 (×2): 2 via ORAL
  Filled 2023-12-21 (×2): qty 2

## 2023-12-21 MED ORDER — SCOPOLAMINE 1 MG/3DAYS TD PT72SCOPOLAMINE 1 MG/3DAYS
1.0000 | MEDICATED_PATCH | Freq: Once | TRANSDERMAL | Status: DC
Start: 2023-12-21 — End: 2023-12-23

## 2023-12-21 MED ORDER — NALOXONE HCL 0.4 MG/ML IJ SOLN: 0.4000 mg | INTRAMUSCULAR | Status: DC | PRN

## 2023-12-21 MED ORDER — CEFAZOLIN SODIUM-DEXTROSE 2-3 GM-%(50ML) IV SOLR
INTRAVENOUS | Status: DC | PRN
  Administered 2023-12-21: 2 g via INTRAVENOUS

## 2023-12-21 MED ORDER — DEXMEDETOMIDINE HCL IN NACL 200 MCG/50ML IV SOLN
INTRAVENOUS | Status: DC | PRN
  Administered 2023-12-21: 20 ug via INTRAVENOUS

## 2023-12-21 SURGICAL SUPPLY — 25 items
CHLORAPREP W/TINT 26 (MISCELLANEOUS) ×2 IMPLANT
CLAMP UMBILICAL CORD (MISCELLANEOUS) ×1 IMPLANT
CLOTH BEACON ORANGE TIMEOUT ST (SAFETY) ×1 IMPLANT
DERMABOND ADVANCED .7 DNX12 (GAUZE/BANDAGES/DRESSINGS) ×1 IMPLANT
DRSG OPSITE POSTOP 4X10 (GAUZE/BANDAGES/DRESSINGS) ×1 IMPLANT
ELECTRODE REM PT RTRN 9FT ADLT (ELECTROSURGICAL) ×1 IMPLANT
EXTRACTOR VACUUM KIWI (MISCELLANEOUS) ×1 IMPLANT
GAUZE PAD ABD 7.5X8 STRL (GAUZE/BANDAGES/DRESSINGS) IMPLANT
GAUZE SPONGE 4X4 12PLY STRL LF (GAUZE/BANDAGES/DRESSINGS) IMPLANT
GLOVE BIOGEL PI IND STRL 7.0 (GLOVE) ×3 IMPLANT
GLOVE ECLIPSE 6.5 STRL STRAW (GLOVE) ×1 IMPLANT
GOWN STRL REUS W/ TWL LRG LVL3 (GOWN DISPOSABLE) ×2 IMPLANT
NS IRRIG 1000ML POUR BTL (IV SOLUTION) ×1 IMPLANT
PAD OB MATERNITY 4.3X12.25 (PERSONAL CARE ITEMS) ×1 IMPLANT
PAD PREP 24X48 CUFFED NSTRL (MISCELLANEOUS) ×1 IMPLANT
RETRACTOR WND ALEXIS 25 LRG (MISCELLANEOUS) IMPLANT
SUT MNCRL AB 0 CT1 27 (SUTURE) ×1 IMPLANT
SUT PLAIN 2 0 XLH (SUTURE) ×1 IMPLANT
SUT VIC AB 0 CT1 36 (SUTURE) ×1 IMPLANT
SUT VIC AB 0 CTX36XBRD ANBCTRL (SUTURE) ×1 IMPLANT
SUT VIC AB 2-0 CT1 TAPERPNT 27 (SUTURE) ×1 IMPLANT
SUT VIC AB 4-0 KS 27 (SUTURE) ×1 IMPLANT
TOWEL OR 17X24 6PK STRL BLUE (TOWEL DISPOSABLE) ×3 IMPLANT
TRAY FOLEY W/BAG SLVR 16FR ST (SET/KITS/TRAYS/PACK) ×1 IMPLANT
WATER STERILE IRR 1000ML POUR (IV SOLUTION) ×1 IMPLANT

## 2023-12-21 NOTE — Progress Notes (Signed)
 Patient ID: Selena Dawson, female   DOB: [birth year], 22 y.o.   MRN: [id number]  Comfortable w epidural in place; s/p cervical foley and buccal cytotec   VSS, afebrile FHR 120-130s, +accels, no decels Ctx q 2-4 mins Cx 5/60/vtx -2 per RN @ 0100  IUP@40 .3wks IOL process  -Check cx in next 2-3 hrs or sooner if ctx space out to eval for Pit or AROM -Anticipate vag delivery  [name] CNM 12/21/2023 1:38 AM

## 2023-12-21 NOTE — Plan of Care (Signed)

## 2023-12-21 NOTE — Progress Notes (Signed)
 Called to room urgently due to FHR deceleration, at that point was down in the 80-90s for about 5 minutes. When I entered CNM Camie Butler Potters and RN team members were at bedside.   FSE had been placed and not tracking FHR well.  Replaced with external monitor, confirmed 80-90s FHR Moved to right lateral position Terbutaline  given FHR improved to the 110-120s   After FHR was improved discussed with patient possible need for future C-section if FHR again went down. Mother and sister were at bedside to hear the discussion.   We reviewed the following in detail in case C-section was needed. Reviewed that if it was for the baby's heart rate this would be because they were not tolerating labor.   The risks of cesarean section were discussed with the patient including but were not limited to: bleeding which may require transfusion or reoperation; infection which may require antibiotics; injury to bowel, bladder, ureters or other surrounding organs; injury to the fetus; need for additional procedures including hysterectomy in the event of a life-threatening hemorrhage; placental abnormalities wth subsequent pregnancies, incisional problems, thromboembolic phenomenon and other postoperative/anesthesia complications.    Suzen Maryan Masters, MD

## 2023-12-21 NOTE — Discharge Summary (Signed)
 Postpartum Discharge Summary     Patient Name: Selena Dawson DOB: 2000/07/12 MRN: [id number]  Date of admission: 12/20/2023 Delivery date:12/21/2023 Delivering provider: ELDONNA SUZEN OCTAVE Date of discharge: 12/23/2023  Admitting diagnosis: Post-dates pregnancy [O48.0] Intrauterine pregnancy: [redacted]w[redacted]d     Secondary diagnosis:  Principal Problem:   Encounter for induction of labor Active Problems:   Cystic fibrosis carrier, antepartum   ASCUS with positive high risk HPV cervical   Uterine fibroids affecting pregnancy in second trimester   Hyperemesis affecting pregnancy, antepartum   Anemia affecting pregnancy   Post-dates pregnancy   S/P primary low transverse C-section  Additional problems: None    Discharge diagnosis: Term Pregnancy Delivered                                              Post partum procedures:IV iron  Augmentation: AROM, Pitocin , Cytotec , and IP Foley Complications: None  Hospital course: Induction of Labor With Cesarean Section   23 y.o. yo G1P1001 at [redacted]w[redacted]d was admitted to the hospital 12/20/2023 for induction of labor. Patient had a labor course significant for NRFHT. The patient went for STAT cesarean section due to Non-Reassuring FHR. Delivery details are as follows: Membrane Rupture Time/Date: 5:47 AM,12/21/2023  Delivery Method:C-Section, Low Transverse Operative Delivery:N/A Details of operation can be found in separate operative Note.    Patient had a postpartum course complicated by nothing. She is ambulating, tolerating a regular diet, passing flatus, and urinating well.  Patient is discharged home in stable condition on 12/23/23.      Newborn Data: Birth date:12/21/2023 Birth time:1:52 PM Gender:Female Living status:Living Apgars:9 ,9  Weight:3050 g                               Magnesium Sulfate received: No BMZ received: No Rhophylac:N/A MMR:N/A T-DaP:Given prenatally Flu: N/A RSV Vaccine received: No Transfusion:No  Immunizations  received: Immunization History  Administered Date(s) Administered   Tdap 09/27/2023    Physical exam  Vitals:   12/22/23 0252 12/22/23 1412 12/22/23 2048 12/23/23 0638  BP: 121/84 120/69 109/64 117/64  Pulse: 76 90 80 71  Resp: 16 17 16 16   Temp: 97.8 F (36.6 C) 97.9 F (36.6 C) (!) 97.5 F (36.4 C) 98.4 F (36.9 C)  TempSrc: Oral Oral Oral Oral  SpO2:  100% 100%   Weight:      Height:       General: alert, cooperative, and no distress Lochia: appropriate Uterine Fundus: firm Incision: Dressing is clean, dry, and intact DVT Evaluation: No significant calf/ankle edema. Labs: Lab Results  Component Value Date   WBC 30.3 (H) 12/22/2023   HGB 7.2 (L) 12/22/2023   HCT 21.5 (L) 12/22/2023   MCV 85.7 12/22/2023   PLT 239 12/22/2023      Latest Ref Rng & Units 12/21/2023    5:44 PM  CMP  Creatinine 0.44 - 1.00 mg/dL 9.40    Edinburgh Score:    12/21/2023    5:30 PM  Edinburgh Postnatal Depression Scale Screening Tool  I have been able to laugh and see the funny side of things. 0  I have looked forward with enjoyment to things. 0  I have blamed myself unnecessarily when things went wrong. 0  I have been anxious or worried for no good reason. 0  I  have felt scared or panicky for no good reason. 0  Things have been getting on top of me. 0  I have been so unhappy that I have had difficulty sleeping. 0  I have felt sad or miserable. 0  I have been so unhappy that I have been crying. 0  The thought of harming myself has occurred to me. 0  Edinburgh Postnatal Depression Scale Total 0   No data recorded   After visit meds:  Allergies as of 12/23/2023   No Known Allergies      Medication List     STOP taking these medications    loperamide  2 MG tablet Commonly known as: Imodium  A-D       TAKE these medications    ACCRUFeR  30 MG Caps Generic drug: Ferric Maltol  Take 1 capsule (30 mg total) by mouth 2 (two) times daily at 8 am and 10 pm.   ferrous  sulfate 325 (65 FE) MG tablet Take 1 tablet (325 mg total) by mouth every other day. Start taking on: December 25, 2023   ibuprofen  600 MG tablet Commonly known as: ADVIL  Take 1 tablet (600 mg total) by mouth every 6 (six) hours.   oxyCODONE  5 MG immediate release tablet Commonly known as: Roxicodone  Take 1 tablet (5 mg total) by mouth every 8 (eight) hours as needed.   prenatal multivitamin Tabs tablet Take 1 tablet by mouth daily at 12 noon.   scopolamine  1 MG/3DAYS Commonly known as: TRANSDERM-SCOP Place 1 patch (1.5 mg total) onto the skin every 3 (three) days.   senna-docusate 8.6-50 MG tablet Commonly known as: Senokot-S Take 2 tablets by mouth daily.         Discharge home in stable condition Infant Feeding: Breast Infant Disposition:home with mother Discharge instruction: per After Visit Summary and Postpartum booklet. Activity: Advance as tolerated. Pelvic rest for 6 weeks.  Diet: routine diet Future Appointments: Future Appointments  Date Time Provider Department Center  12/28/2023  9:00 AM [facility] NURSE [facility] None  01/19/2024 10:35 AM Wallace Joesph LABOR, PA [facility] None   Follow up Visit:  Follow-up Information     [facility] Follow up in 4 week(s).   Specialty: Obstetrics and Gynecology Why: 1 week incision check, 4-6wk postpartum Contact information: [address] [place]  [address] [phone]               Message sent 12/23/2023   Please schedule this patient for a In person postpartum visit in 4 weeks with the following provider: Any provider. Additional Postpartum F/U:Incision check 1 week  Low risk pregnancy complicated by: anemia Delivery mode:  C-Section, Low Transverse Anticipated Birth Control:  IUD, needs OP was not done postplacental   12/23/2023 Alain Sor, MD

## 2023-12-21 NOTE — Op Note (Addendum)
 Cesarean Section Operative Note   Patient: Selena Dawson  Date of Procedure: 12/21/2023  Procedure: Primary Low Transverse Cesarean   Indications: fetal distress  Pre-operative Diagnosis: emergancy cesarean section for fetal distress primary.   Post-operative Diagnosis: Same  TOLAC Candidate: Yes   Surgeon: Surgeons and Role:    DEWAINE Eldonna Suzen Maryan, MD - Primary    * Ilean, Norleen GAILS, MD - Assisting  Assistants: Barabara Maier, DO   An experienced assistant was required given the standard of surgical care given the complexity of the case.  This assistant was needed for exposure, dissection, suctioning, retraction, instrument exchange, assisting with delivery with administration of fundal pressure, and for overall help during the procedure.   Anesthesia: epidural  Anesthesiologist: Epifanio Charleston, MD   Antibiotics: Cefazolin  and Azithromycin    Estimated Blood Loss: 680 ml   Total IV Fluids: 1500 ml  Urine Output: 300 ml  Specimens: none   Complications: no complications   Indications: Selena Dawson is a 23 y.o. G1P1001 with an IUP [redacted]w[redacted]d presenting for unscheduled, emergent cesarean secondary to the indications listed above. Clinical course notable for NRFHT earlier in the day resolved with position change and terbutaline . Then had repeated fetal bradycardia, not resolved with position changes.  The risks of cesarean section discussed with the patient included but were not limited to: bleeding which may require transfusion or reoperation; infection which may require antibiotics; injury to bowel, bladder, ureters or other surrounding organs; injury to the fetus; need for additional procedures including hysterectomy in the event of a life-threatening hemorrhage; placental abnormalities with subsequent pregnancies, incisional problems, thromboembolic phenomenon and other postoperative/anesthesia complications. The patient concurred with the proposed plan, giving informed written  consent for the procedure. Patient NPO status waived given urgency of case. Anesthesia and OR aware. Preoperative prophylactic antibiotics and SCDs ordered on call to the OR.   Findings: Viable infant in cephalic presentation, no nuchal cord present. Apgars 9, 9, . Weight 3050 g. Clear amniotic fluid. Normal placenta, three vessel cord. Fibroid, subserosal, on anterior uterus, Normal bilateral fallopian tubes, Normal bilateral ovaries. No adhesive disease was encountered.  Procedure Details: A Time Out was held and the above information confirmed. The patient received intravenous antibiotics and had sequential compression devices applied to her lower extremities preoperatively. The patient was taken back to the operative suite where epidural anesthesia was administered. After induction of anesthesia, the patient was draped and prepped in the usual sterile manner and placed in a dorsal supine position with a leftward tilt. A low transverse skin incision was made with scalpel and carried down through the subcutaneous tissue to the fascia. Fascial incision was made and extended transversely. The fascia was separated from the underlying rectus tissue superiorly and inferiorly. The rectus muscles were separated in the midline bluntly and the peritoneum was entered bluntly. A Rich retractor and bladder blade were placed to aid in visualization of the uterus. A bladder flap was not developed. A low transverse uterine incision was made. The infant was successfully delivered from cephalic presentation, the umbilical cord was clamped after 1 minute. Cord ph was sent, and cord blood was obtained for evaluation. The placenta was removed Intact and appeared normal. The uterine incision was closed with a single layer running unlocked suture of 0-Monocryl. Due to ongoing bleeding a second layer of 0 Monocryl was placed in an imbricating fashion, after which there was excellent hemostasis. The abdomen and the pelvis were  cleared of all clot and debris and the Thersia was  removed. Hemostasis was confirmed on all surfaces.  The peritoneum was reapproximated using 2-0 vicryl . The fascia was then closed using 0 Vicryl in a running fashion. The subcutaneous layer was reapproximated with 2-0 plain gut suture. The skin was closed with a 4-0 vicryl subcuticular stitch. The patient tolerated the procedure well. Sponge, lap, instrument and needle counts were correct x 2. She was taken to the recovery room in stable condition.  Disposition: PACU - hemodynamically stable.    Signed: Barabara Maier, DO FMOB Fellow, Faculty practice [facility], Center for [facility] of Attending Supervision of [facility] Fellow: Evaluation and management procedures were performed by the Family Medicine OB Fellow under my supervision.  I have reviewed the Fellow's note and chart, and I agree with the management and plan.  Suzen Maryan Masters, MD, MPH, ABFM Attending Physician Center for [facility] , [facility]

## 2023-12-21 NOTE — Anesthesia Procedure Notes (Signed)
 Epidural Patient location during procedure: OB Start time: 12/21/2023 12:29 AM End time: 12/21/2023 12:34 AM  Staffing Anesthesiologist: Peggye Delon Brunswick, MD Performed: anesthesiologist   Preanesthetic Checklist Completed: patient identified, IV checked, site marked, risks and benefits discussed, surgical consent, monitors and equipment checked, pre-op evaluation and timeout performed  Epidural Patient position: sitting Prep: DuraPrep and site prepped and draped Patient monitoring: continuous pulse ox and blood pressure Approach: midline Location: L3-L4 Injection technique: LOR saline  Needle:  Needle type: Tuohy  Needle gauge: 17 G Needle length: 9 cm and 9 Needle insertion depth: 5 cm Catheter type: closed end flexible Catheter size: 19 Gauge Catheter at skin depth: 9 cm Test dose: negative  Assessment Events: blood not aspirated, no cerebrospinal fluid, injection not painful, no injection resistance, no paresthesia and negative IV test  Additional Notes The patient has requested an epidural for labor pain management. Risks and benefits including, but not limited to, infection, bleeding, local anesthetic toxicity, headache, hypotension, back pain, block failure, etc. were discussed with the patient. The patient expressed understanding and consented to the procedure. I confirmed that the patient has no bleeding disorders and is not taking blood thinners. I confirmed the patient's last platelet count with the nurse. A time-out was performed immediately prior to the procedure. Please see nursing documentation for vital signs. Sterile technique was used throughout the whole procedure. Once LOR achieved, the epidural catheter threaded easily without resistance. Aspiration of the catheter was negative for blood and CSF. The epidural was dosed slowly and an infusion was started.  1 attempt(s)Reason for block:procedure for pain

## 2023-12-21 NOTE — Anesthesia Preprocedure Evaluation (Signed)
 Anesthesia Evaluation  Patient identified by MRN, date of birth, ID band Patient awake    History of Anesthesia Complications Negative for: history of anesthetic complications  Airway Mallampati: III  TM Distance: >3 FB Neck ROM: Full    Dental   Pulmonary neg pulmonary ROS   breath sounds clear to auscultation       Cardiovascular negative cardio ROS  Rhythm:Regular Rate:Normal     Neuro/Psych negative neurological ROS     GI/Hepatic negative GI ROS, Neg liver ROS,,,  Endo/Other  negative endocrine ROS    Renal/GU negative Renal ROS     Musculoskeletal   Abdominal   Peds  Hematology  (+) Blood dyscrasia, anemia Lab Results      Component                Value               Date                      WBC                      9.9                 12/20/2023                HGB                      9.2 (L)             12/20/2023                HCT                      27.9 (L)            12/20/2023                MCV                      86.4                12/20/2023                PLT                      300                 12/20/2023              Anesthesia Other Findings   Reproductive/Obstetrics (+) Pregnancy                              Anesthesia Physical Anesthesia Plan  ASA: 2  Anesthesia Plan: Epidural   Post-op Pain Management:    Induction:   PONV Risk Score and Plan:   Airway Management Planned: Natural Airway  Additional Equipment:   Intra-op Plan:   Post-operative Plan:   Informed Consent: I have reviewed the patients History and Physical, chart, labs and discussed the procedure including the risks, benefits and alternatives for the proposed anesthesia with the patient or authorized representative who has indicated his/her understanding and acceptance.       Plan Discussed with: Anesthesiologist  Anesthesia Plan Comments: (I have discussed risks of  neuraxial anesthesia including but not limited to infection, bleeding, nerve injury, back pain,  headache, seizures, and failure of block. Patient denies bleeding disorders and is not currently anticoagulated. Labs have been reviewed. Risks and benefits discussed. All patient's questions answered.  )        Anesthesia Quick Evaluation

## 2023-12-21 NOTE — Op Note (Signed)
 Cesarean Section Operative Note   Patient: Selena Dawson  Date of Procedure: 12/21/2023  Procedure: Primary Low Transverse Cesarean   Indications: non-reassuring fetal status/Fetal distress  Pre-operative Diagnosis: emergancy cesarean section for fetal distress primary.   Post-operative Diagnosis: Same  TOLAC Candidate: Yes   Surgeon: Surgeons and Role:    * Eldonna Suzen Octave, MD - Primary    * Ilean, Norleen GAILS, MD - Assisting  Assistants: Steffan Ilean MD, Daved Maier MD  An experienced assistant was required given the standard of surgical care given the complexity of the case.  This assistant was needed for exposure, dissection, suctioning, retraction, instrument exchange, assisting with delivery with administration of fundal pressure, and for overall help during the procedure.   Anesthesia: spinal  Anesthesiologist: No responsible provider has been recorded for the case.   Antibiotics: Cefazolin  and Azithromycin    Estimated Blood Loss: 680 ml   Total IV Fluids: 1500 ml  Urine Output: 300 cc OF clear urine  Specimens: Placenta   Complications: no complications   Indications: Selena Dawson is a 23 y.o. G1P1001 with an IUP [redacted]w[redacted]d presenting for unscheduled, emergent cesarean secondary to the indications listed above. Clinical course notable for fetal HR deceleration at 0930 and then fetal bradycardia the necessitated delivery.  The risks of cesarean section discussed with the patient included but were not limited to: bleeding which may require transfusion or reoperation; infection which may require antibiotics; injury to bowel, bladder, ureters or other surrounding organs; injury to the fetus; need for additional procedures including hysterectomy in the event of a life-threatening hemorrhage; placental abnormalities with subsequent pregnancies, incisional problems, thromboembolic phenomenon and other postoperative/anesthesia complications. The patient concurred with the  proposed plan, giving informed written consent for the procedure. Patient has been NPO since last night she will remain NPO for procedure. Anesthesia and OR aware. Preoperative prophylactic antibiotics and SCDs ordered on call to the OR.   Findings: Viable infant in cephalic presentation, no nuchal cord present. Apgars 9, 9, . Weight 3050 g. Clear amniotic fluid. Normal placenta, three vessel cord. Normal uterus- 3 cm subserosal fibroid noted, Normal bilateral fallopian tubes, Normal bilateral ovaries. No adhesive disease was encountered.  Procedure Details: A Time Out was held and the above information confirmed. The patient received intravenous antibiotics and had sequential compression devices applied to her lower extremities preoperatively. The patient was taken back to the operative suite where epidural, spinal anesthesia was administered. After induction of anesthesia, the patient was draped and prepped in the usual sterile manner and placed in a dorsal supine position with a leftward tilt. A low transverse skin incision was made with scalpel and carried down through the subcutaneous tissue to the fascia. Fascial incision was made and extended transversely. The fascia was separated from the underlying rectus tissue superiorly and inferiorly. The rectus muscles were separated in the midline bluntly and the peritoneum was entered bluntly. A Rich retractor and bladder blade were placed to aid in visualization of the uterus. A bladder flap was not developed. A low transverse uterine incision was made. The infant was successfully delivered from cephalic presentation, the umbilical cord was clamped immediately. Cord ph was not sent, and cord blood was obtained for evaluation. The placenta was removed Intact and appeared normal. The uterine incision was closed with a single layer running unlocked suture of 0-Monocryl. Overall, excellent hemostasis was noted. The abdomen and the pelvis were cleared of all clot and  debris and the Thersia was removed. Hemostasis was confirmed on all  surfaces.  The peritoneum was reapproximated using 2-0 vicryl . The fascia was then closed using 0 Vicryl in a running fashion. The subcutaneous layer was reapproximated with 2-0 plain gut suture. The skin was closed with a 4-0 vicryl subcuticular stitch. The patient tolerated the procedure well. Sponge, lap, instrument and needle counts were correct x 2. She was taken to the recovery room in stable condition.  Disposition: PACU - hemodynamically stable.    Signed: Suzen Maryan Masters, MD/MPH Attending Family Medicine Physician, [facility] for [facility], [facility]

## 2023-12-21 NOTE — Progress Notes (Signed)
 Called to the room for another fetal bradycardia Multiple position changes were attempted and no resolution FHR was persistently in the 80-90s Given terbutaline  and given persistently < 90 for > 9 minutes I called a Code Cesearan I informed the patient about the situation and we had previously consented for CS (see note at 930AM) Patient agreed with plan    Suzen Maryan Masters, MD

## 2023-12-21 NOTE — Progress Notes (Signed)
 Patient ID: Selena Dawson, female   DOB: 2001-01-19, 23 y.o.   MRN: [id number]  Comfortable w epidural  BPs 110/66, 98/61, other VSS FHR 120s, +accels, min-mod LTV, no decels Ctx q 2-4 mins Cx 7/60/vtx -2; AROM for clear fluid  IUP@40 .3wks Early active labor  Uptitrate Pit to keep ctx reg Anticipate vag delivery  Selena Dawson [facility] 12/21/2023 6:07 AM

## 2023-12-21 NOTE — Transfer of Care (Signed)
 Immediate Anesthesia Transfer of Care Note  Patient: Selena Dawson  Procedure(s) Performed: CESAREAN DELIVERY  Patient Location: PACU  Anesthesia Type:Epidural  Level of Consciousness: awake, alert , and oriented  Airway & Oxygen Therapy: Patient Spontanous Breathing  Post-op Assessment: Report given to RN and Post -op Vital signs reviewed and stable  Post vital signs: Reviewed and stable  Last Vitals:  Vitals Value Taken Time  BP 102/59 12/21/23 15:15  Temp 36.4 C 12/21/23 15:15  Pulse 106 12/21/23 15:22  Resp 17 12/21/23 15:22  SpO2 100 % 12/21/23 15:22  Vitals shown include unfiled device data.  Last Pain:  Vitals:   12/21/23 1515  TempSrc: Oral  PainSc:          Complications: No notable events documented.

## 2023-12-22 LAB — CBC
HCT: 21.5 % — ABNORMAL LOW (ref 36.0–46.0)
Hemoglobin: 7.2 g/dL — ABNORMAL LOW (ref 12.0–15.0)
MCH: 28.7 pg (ref 26.0–34.0)
MCHC: 33.5 g/dL (ref 30.0–36.0)
MCV: 85.7 fL (ref 80.0–100.0)
Platelets: 239 K/uL (ref 150–400)
RBC: 2.51 MIL/uL — ABNORMAL LOW (ref 3.87–5.11)
RDW: 13.6 % (ref 11.5–15.5)
WBC: 30.3 K/uL — ABNORMAL HIGH (ref 4.0–10.5)
nRBC: 0 % (ref 0.0–0.2)

## 2023-12-22 MED ORDER — SODIUM CHLORIDE 0.9 % IV SOLN
500.0000 mg | Freq: Once | INTRAVENOUS | Status: AC
  Administered 2023-12-22: 500 mg via INTRAVENOUS
  Filled 2023-12-22: qty 25

## 2023-12-22 MED ORDER — SODIUM CHLORIDE 0.9 % IV SOLN: INTRAVENOUS | Status: AC | PRN

## 2023-12-22 MED ORDER — DIPHENHYDRAMINE HCL 50 MG/ML IJ SOLN: 25.0000 mg | Freq: Once | INTRAMUSCULAR | Status: DC | PRN

## 2023-12-22 MED ORDER — EPINEPHRINE 0.3 MG/0.3ML IJ SOAJ: 0.3000 mg | Freq: Once | INTRAMUSCULAR | Status: DC | PRN

## 2023-12-22 MED ORDER — SODIUM CHLORIDE 0.9 % IV BOLUS: 500.0000 mL | Freq: Once | INTRAVENOUS | Status: DC | PRN

## 2023-12-22 MED ORDER — ALBUTEROL SULFATE (2.5 MG/3ML) 0.083% IN NEBU: 2.5000 mg | INHALATION_SOLUTION | Freq: Once | RESPIRATORY_TRACT | Status: DC | PRN

## 2023-12-22 MED ORDER — IRON SUCROSE 500 MG IVPB - SIMPLE MED
500.0000 mg | Freq: Once | INTRAVENOUS | Status: DC
  Filled 2023-12-22: qty 275

## 2023-12-22 MED ORDER — METHYLPREDNISOLONE SODIUM SUCC 125 MG IJ SOLR: 125.0000 mg | Freq: Once | INTRAMUSCULAR | Status: DC | PRN

## 2023-12-22 NOTE — Progress Notes (Signed)
 POSTPARTUM PROGRESS NOTE  Subjective: Selena Dawson is a 23 y.o. G1P1001 s/p pLTCS at [redacted]w[redacted]d.  She reports she is doing well. No acute events overnight. She denies any problems with ambulating, voiding or po intake. Denies nausea or vomiting. She has passed flatus. Pain is well controlled.  Lochia is light.  Objective: Blood pressure 121/84, pulse 76, temperature 97.8 F (36.6 C), temperature source Oral, resp. rate 16, height 5' 3 (1.6 m), weight 83.5 kg, last menstrual period 03/13/2023, SpO2 100%, unknown if currently breastfeeding.  Physical Exam:  General: alert, cooperative and no distress Chest: no respiratory distress Abdomen: soft, non-tender  Uterine Fundus: firm and at level of umbilicus Extremities: No calf swelling or tenderness  Trace edema  Recent Labs    12/21/23 1744 12/22/23 0547  HGB 8.0* 7.2*  HCT 24.2* 21.5*    Assessment/Plan: Selena Dawson is a 23 y.o. G1P1001 s/p pLTCS at [redacted]w[redacted]d for NRFHT.  Routine Postpartum Care: Doing well, pain well-controlled.  -- Continue routine care, lactation support  -- Contraception: IUD OP -- Feeding: breast  Dispo: Plan for discharge 8/23.  Nicholaus Almarie HERO, MD OB Fellow 12/22/2023 11:56 AM

## 2023-12-22 NOTE — Anesthesia Postprocedure Evaluation (Signed)
 Anesthesia Post Note  Patient: Selena Dawson  Procedure(s) Performed: CESAREAN DELIVERY     Patient location during evaluation: PACU Anesthesia Type: Epidural Level of consciousness: awake and alert Pain management: pain level controlled Vital Signs Assessment: post-procedure vital signs reviewed and stable Respiratory status: spontaneous breathing and respiratory function stable Cardiovascular status: blood pressure returned to baseline and stable Postop Assessment: epidural receding Anesthetic complications: no   No notable events documented.  Last Vitals:  Vitals:   12/21/23 2322 12/22/23 0252  BP: 116/64 121/84  Pulse: 69 76  Resp: 17 16  Temp: 36.6 C 36.6 C  SpO2: 100%     Last Pain:  Vitals:   12/22/23 0518  TempSrc:   PainSc: 7                  Epifanio Lamar BRAVO

## 2023-12-23 MED ORDER — SENNOSIDES-DOCUSATE SODIUM 8.6-50 MG PO TABS: 2.0000 | ORAL_TABLET | ORAL | 0 refills | Status: DC

## 2023-12-23 MED ORDER — FERROUS SULFATE 325 (65 FE) MG PO TABS: 325.0000 mg | ORAL_TABLET | ORAL | 0 refills | Status: DC

## 2023-12-23 MED ORDER — OXYCODONE HCL 5 MG PO TABS: 5.0000 mg | ORAL_TABLET | Freq: Three times a day (TID) | ORAL | 0 refills | Status: DC | PRN

## 2023-12-23 MED ORDER — IBUPROFEN 600 MG PO TABS: 600.0000 mg | ORAL_TABLET | Freq: Four times a day (QID) | ORAL | 0 refills | Status: DC

## 2023-12-23 MED ORDER — FERROUS SULFATE 325 (65 FE) MG PO TABS
325.0000 mg | ORAL_TABLET | ORAL | Status: DC
  Administered 2023-12-23: 325 mg via ORAL
  Filled 2023-12-23: qty 1

## 2023-12-23 NOTE — Discharge Instructions (Signed)

## 2023-12-24 ENCOUNTER — Inpatient Hospital Stay (HOSPITAL_COMMUNITY)

## 2023-12-24 ENCOUNTER — Encounter (HOSPITAL_COMMUNITY): Payer: Self-pay

## 2023-12-24 ENCOUNTER — Inpatient Hospital Stay (HOSPITAL_COMMUNITY): Admission: AD | Admit: 2023-12-24 | Payer: Self-pay | Source: Home / Self Care

## 2023-12-25 LAB — SURGICAL PATHOLOGY

## 2023-12-28 ENCOUNTER — Ambulatory Visit

## 2024-01-02 ENCOUNTER — Telehealth (HOSPITAL_COMMUNITY): Payer: Self-pay | Admitting: *Deleted

## 2024-01-02 NOTE — Telephone Encounter (Signed)
 01/02/2024  Name: Selena Dawson MRN: [id number] DOB: 28-Dec-2000  Reason for Call:  Transition of Care Hospital Discharge Call  Contact Status: Patient Contact Status: Complete  Language assistant needed: Interpreter Mode: Interpreter Not Needed        Follow-Up Questions: Do You Have Any Concerns About Your Health As You Heal From Delivery?: No Do You Have Any Concerns About Your Infants Health?: No  Edinburgh Postnatal Depression Scale:  In the Past 7 Days:    PHQ2-9 Depression Scale:     Discharge Follow-up: Edinburgh score requires follow up?:  (says answers are the same as in the hospital when score was 0, endorses she is doing well emotionally)  Post-discharge interventions: Reviewed Newborn Safe Sleep Practices  Mliss Sieve, RN 01/02/2024 10:39

## 2024-01-19 ENCOUNTER — Encounter: Payer: Self-pay | Admitting: Family Medicine

## 2024-01-19 ENCOUNTER — Ambulatory Visit (INDEPENDENT_AMBULATORY_CARE_PROVIDER_SITE_OTHER): Admitting: Family Medicine

## 2024-01-19 DIAGNOSIS — Z30430 Encounter for insertion of intrauterine contraceptive device: Secondary | ICD-10-CM

## 2024-01-19 DIAGNOSIS — Z3202 Encounter for pregnancy test, result negative: Secondary | ICD-10-CM | POA: Diagnosis not present

## 2024-01-19 DIAGNOSIS — F53 Postpartum depression: Secondary | ICD-10-CM | POA: Diagnosis not present

## 2024-01-19 DIAGNOSIS — Z3009 Encounter for other general counseling and advice on contraception: Secondary | ICD-10-CM

## 2024-01-19 LAB — POCT URINE PREGNANCY: Preg Test, Ur: NEGATIVE

## 2024-01-19 MED ORDER — LEVONORGESTREL 20 MCG/DAY IU IUD
1.0000 | INTRAUTERINE_SYSTEM | Freq: Once | INTRAUTERINE | Status: AC
Start: 2024-01-19 — End: 2024-01-19
  Administered 2024-01-19: 1 via INTRAUTERINE

## 2024-01-19 NOTE — Progress Notes (Signed)
 Post Partum Visit Note  Selena Dawson is a 23 y.o. G60P1001 female who presents for a postpartum visit. She is 4 weeks postpartum following a primary cesarean section.  I have fully reviewed the prenatal and intrapartum course. The delivery was at [redacted]w[redacted]d gestational weeks.  Anesthesia: epidural. Postpartum course has been okay. Baby is doing well. Baby is feeding by bottle - Similac Advance total care. Bleeding no bleeding. Bowel function is normal. Bladder function is normal. Patient is not sexually active. Contraception method is IUD. Postpartum depression screening: positive.   The pregnancy intention screening data noted above was reviewed. Potential methods of contraception were discussed. The patient elected to proceed with No data recorded.   Edinburgh Postnatal Depression Scale - 01/19/24 1103       Edinburgh Postnatal Depression Scale:  In the Past 7 Days   I have been able to laugh and see the funny side of things. 0    I have looked forward with enjoyment to things. 0    I have blamed myself unnecessarily when things went wrong. 2    I have been anxious or worried for no good reason. 2    I have felt scared or panicky for no good reason. 0    Things have been getting on top of me. 2    I have been so unhappy that I have had difficulty sleeping. 0    I have felt sad or miserable. 0    I have been so unhappy that I have been crying. 0    The thought of harming myself has occurred to me. 0    Edinburgh Postnatal Depression Scale Total 6          Health Maintenance Due  Topic Date Due   HPV VACCINES (1 - 3-dose series) Never done   Meningococcal B Vaccine (1 of 2 - Standard) Never done   Hepatitis B Vaccines 19-59 Average Risk (1 of 3 - 19+ 3-dose series) Never done   Influenza Vaccine  Never done   COVID-19 Vaccine (1 - 2024-25 season) Never done    The following portions of the patient's history were reviewed and updated as appropriate: allergies, current medications,  past family history, past medical history, past social history, past surgical history, and problem list.  Review of Systems Pertinent items are noted in HPI.  Objective:  BP 135/75   Pulse (!) 113   Ht 5' 3 (1.6 m)   Wt 166 lb (75.3 kg)   LMP 03/13/2023 (Exact Date)   Breastfeeding No   BMI 29.41 kg/m    General:  alert, cooperative, and no distress   Breasts:  normal  Lungs: clear to auscultation bilaterally  Heart:  regular rate and rhythm, S1, S2 normal, no murmur, click, rub or gallop  Abdomen: soft, non-tender; bowel sounds normal; no masses,  no organomegaly   Wound Well healing  GU exam:  normal   IUD Insertion Procedure Note Patient identified, informed consent performed, consent signed.   Discussed risks of irregular bleeding, cramping, infection, malpositioning or misplacement of the IUD outside the uterus which may require further procedure such as laparoscopy. Also discussed >99% contraception efficacy, increased risk of ectopic pregnancy with failure of method.   Emphasized that this did not protect against STIs, condoms recommended during all sexual encounters. Time out was performed.  Chaperone present.  Urine pregnancy test negative.  Speculum placed in the vagina.  Cervix visualized.  Cleaned with Betadine x 2.  Grasped  anteriorly with a single tooth tenaculum.  Uterus sounded to 7 cm.  Mirena  IUD placed per manufacturer's recommendations.  Strings trimmed to 3 cm. Tenaculum was removed, good hemostasis noted.  Patient tolerated procedure well.   Patient was given post-procedure instructions.  She was advised to have backup contraception for one week.  Patient was also asked to check IUD strings periodically and follow up in 4 weeks for IUD check.   Joesph Sear, PA-C [facility] [employer], [facility]  Assessment:   1. Postpartum care and examination (Primary)  2. Encounter for counseling regarding contraception  3. Encounter for IUD  insertion   Normal postpartum exam.   Plan:   Essential components of care per ACOG recommendations:  1.  Mood and well being: Patient with positive depression screening today. Reviewed local resources for support. Referral to [facility] sent. - Patient tobacco use? No.   - hx of drug use? No.    2. Infant care and feeding:  -Patient currently breastmilk feeding? No.  -Social determinants of health (SDOH) reviewed in EPIC. Provided list of SDOH resources. Encouraged to contact Medicaid to find out about enrolling her daughter.  3. Sexuality, contraception and birth spacing - Patient does not want a pregnancy in the next year.  Desired family size is 2 children.  - Reviewed reproductive life planning. Reviewed contraceptive methods based on pt preferences and effectiveness.  Patient desired IUD or IUS today.   - Discussed birth spacing of 18 months  4. Sleep and fatigue -Encouraged family/partner/community support of 4 hrs of uninterrupted sleep to help with mood and fatigue  5. Physical Recovery  - Discussed patients delivery and complications. She describes her labor as good. She had good care overall, but the emergent nature of her c-section was stressful. - Patient had a C-section emergent. Patient had a no laceration. Perineal healing reviewed. Patient expressed understanding - Patient has urinary incontinence? No. - Patient is safe to resume physical and sexual activity  6.  Health Maintenance - HM due items addressed No - not indicated - Last pap smear  Diagnosis  Date Value Ref Range Status  06/12/2023 (A)  Final   - Atypical squamous cells of undetermined significance (ASC-US )   Pap smear not done at today's visit. Repeat pap in February 2026. -Breast Cancer screening indicated? No.   7. Chronic Disease/Pregnancy Condition follow up: None  - PCP follow up  Joesph DELENA Sear, PA [facility] [employer], [facility]

## 2024-01-19 NOTE — Patient Instructions (Signed)
 IUD AFTERCARE INSTRUCTIONS   Warning Signs  Call the clinic if any of the following occurs:   Severe abdominal pain or cramping   Unusual bleeding   Fever or chills   Foul smelling vaginal discharge   Painful intercourse   Positive pregnancy test.  1. Uterine cramping is common after IUD placement. You can help relieve the discomfort  with heating pads, Tylenol  (acetaminophen ), Aspirin or Advil  (ibuprofen ). If your  cramping becomes very painful, please call the clinic.   2. Irregular bleeding and spotting is normal for the first few months after the IUD is placed.  In some cases, women may experience irregular bleeding or spotting for up to six months  after the IUD is placed. This bleeding can be annoying at first but usually will become  lighter with the Mirena /Liletta  IUD quickly. Call the clinic if your bleeding is excessive and not  getting better.   3. Your period will likely be shorter and lighter with a Mirena  IUD. Approximately 40% of  women will stop having periods altogether with the Mirena /Liletta  IUD. Your period may be  heavier and longer with the Paragard IUD.   4. IUDs do not protect against sexually transmitted infections including the AIDS virus  (HIV), warts (HPV), gonorrhea, Chlamydia, and herpes. Condoms should be used to  decrease the risk sexually transmitted infections. If you think that you have been exposed  to a sexually transmitted infection, please call the clinic.   5. If you had the IUD placed for birth control, the Paragard IUD is effective immediately.  The Mirena /Liletta  IUD is effective immediately if it was inserted within seven days after the  start of your period. If you have Mirena /Liletta  inserted at any other time during your menstrual  cycle, use another method of birth control, like condoms for at least 7 days.   6. It is possible for the IUD to come out of the uterus. If it does slip out of place, it is most  likely to happen in the  first few months after being put in. To make sure your IUD is in  place, you can feel for the IUD strings between periods. To check for strings, wash your  hands. Then, sit or squat down. Place one finger into your vagina until you feel your  cervix. It will feel hard and rubbery, like the end of your nose. The string ends should be  coming through your cervix. Do not pull on the strings. If the strings feel much longer  than before, if you feel the hard plastic part of the IUD, or if you cannot feel the strings at  all, the IUD may have moved out of place. Please call the clinic and consider using a  back up form of birth control until you are seen.   7. Keep your follow-up appointment for 4-6 weeks after the IUD has been placed.   8. Pregnancy is unlikely after IUD placement, but can happen. If you have early pregnancy  symptoms like nausea and vomiting, breast tenderness, frequent urination or abdominal  pain, you can take a pregnancy test. Please call the clinic if you have any concerns or if  your pregnancy test is positive.   9. The IUD should only be removed by a healthcare provider.  The Mirena /Liletta  IUD should be removed and/or replaced after 8 years.  The Paragard IUD should be removed and/or replaced after 10 years.    You can call Medicaid to ask them about  the next steps of getting Medicaid for your daughter!  Resources for [employer], Housing, and Food: Building services engineer [employer] Program -- Sponsored by [employer], this program provides financial support for prenatal care for birthing people who do not qualify for Medicaid and are under/uninsured.  Emergency Shelters [facility]: [address] [place], [place], [place] - [phone]  [facility]: [address] [place], [place], [place] - [phone] or [phone] [facility] ([facility]): [address] [place], [place] - [phone] [facility]:  [address], [place], [place] - [phone] [facility]: [address], [place], [place] - [phone]  Food Pantries [facility] [address] [place], [place], [place] [address] [phone]. [phone] Visit or call between 8:30am-5pm  [facility] [address] [place], [place] [address] [phone] Wed. & Sat. 2-6pm; appointments preferred  [facility] [address], [place], [place] [address] (Appointments required for food)  [phone]  [facility] [address] [place], [place] [address] [phone] Thurs. 2-4pm  [employer] [facility] [employer]  [address] [place], [place] [address] [phone] Mon.-Fri. 1:00-4:30pm  [facility] [address], [place], [place] [address] [phone] Tuesday and Thursday 9am - 12pm

## 2024-01-25 ENCOUNTER — Encounter: Payer: Self-pay | Admitting: Physician Assistant

## 2024-01-25 ENCOUNTER — Other Ambulatory Visit: Payer: Self-pay

## 2024-03-19 ENCOUNTER — Encounter: Payer: Self-pay | Admitting: Physician Assistant

## 2024-05-13 ENCOUNTER — Ambulatory Visit: Payer: Self-pay | Admitting: Obstetrics and Gynecology
